# Patient Record
Sex: Male | Born: 1945 | Race: White | Hispanic: No | Smoking: Never smoker
Health system: Southern US, Community
[De-identification: ages and names within clinical notes are randomized; demographics above are authoritative.]

## PROBLEM LIST (undated history)

## (undated) DIAGNOSIS — T8859XA Other complications of anesthesia, initial encounter: Secondary | ICD-10-CM

## (undated) DIAGNOSIS — M25512 Pain in left shoulder: Secondary | ICD-10-CM

## (undated) DIAGNOSIS — C4490 Unspecified malignant neoplasm of skin, unspecified: Secondary | ICD-10-CM

## (undated) DIAGNOSIS — I499 Cardiac arrhythmia, unspecified: Secondary | ICD-10-CM

## (undated) DIAGNOSIS — A0472 Enterocolitis due to Clostridium difficile, not specified as recurrent: Secondary | ICD-10-CM

## (undated) DIAGNOSIS — I4892 Unspecified atrial flutter: Secondary | ICD-10-CM

## (undated) DIAGNOSIS — M1990 Unspecified osteoarthritis, unspecified site: Secondary | ICD-10-CM

## (undated) DIAGNOSIS — T4145XA Adverse effect of unspecified anesthetic, initial encounter: Secondary | ICD-10-CM

## (undated) DIAGNOSIS — K862 Cyst of pancreas: Secondary | ICD-10-CM

## (undated) DIAGNOSIS — M009 Pyogenic arthritis, unspecified: Secondary | ICD-10-CM

## (undated) DIAGNOSIS — Z9889 Other specified postprocedural states: Secondary | ICD-10-CM

## (undated) DIAGNOSIS — Z789 Other specified health status: Secondary | ICD-10-CM

## (undated) DIAGNOSIS — M25511 Pain in right shoulder: Secondary | ICD-10-CM

## (undated) DIAGNOSIS — R112 Nausea with vomiting, unspecified: Secondary | ICD-10-CM

## (undated) DIAGNOSIS — M47812 Spondylosis without myelopathy or radiculopathy, cervical region: Secondary | ICD-10-CM

## (undated) HISTORY — DX: Pain in left shoulder: M25.512

## (undated) HISTORY — PX: OTHER SURGICAL HISTORY: SHX169

## (undated) HISTORY — DX: Unspecified osteoarthritis, unspecified site: M19.90

## (undated) HISTORY — DX: Cyst of pancreas: K86.2

## (undated) HISTORY — PX: JOINT REPLACEMENT: SHX530

## (undated) HISTORY — DX: Spondylosis without myelopathy or radiculopathy, cervical region: M47.812

## (undated) HISTORY — DX: Pyogenic arthritis, unspecified: M00.9

## (undated) HISTORY — PX: APPENDECTOMY: SHX54

## (undated) HISTORY — PX: MOHS SURGERY: SUR867

## (undated) HISTORY — DX: Enterocolitis due to Clostridium difficile, not specified as recurrent: A04.72

## (undated) HISTORY — DX: Pain in right shoulder: M25.511

## (undated) HISTORY — PX: INGUINAL HERNIA REPAIR: SUR1180

## (undated) HISTORY — DX: Other specified health status: Z78.9

## (undated) HISTORY — DX: Unspecified malignant neoplasm of skin, unspecified: C44.90

---

## 2000-02-28 HISTORY — PX: COLONOSCOPY: SHX174

## 2006-01-29 ENCOUNTER — Ambulatory Visit: Payer: Self-pay | Admitting: Internal Medicine

## 2007-05-06 ENCOUNTER — Telehealth (INDEPENDENT_AMBULATORY_CARE_PROVIDER_SITE_OTHER): Payer: Self-pay | Admitting: *Deleted

## 2007-05-06 ENCOUNTER — Ambulatory Visit: Payer: Self-pay | Admitting: Internal Medicine

## 2007-05-10 ENCOUNTER — Telehealth (INDEPENDENT_AMBULATORY_CARE_PROVIDER_SITE_OTHER): Payer: Self-pay | Admitting: *Deleted

## 2008-02-28 HISTORY — PX: TOTAL KNEE ARTHROPLASTY: SHX125

## 2008-07-24 ENCOUNTER — Telehealth (INDEPENDENT_AMBULATORY_CARE_PROVIDER_SITE_OTHER): Payer: Self-pay | Admitting: *Deleted

## 2008-09-14 ENCOUNTER — Encounter (INDEPENDENT_AMBULATORY_CARE_PROVIDER_SITE_OTHER): Payer: Self-pay | Admitting: *Deleted

## 2008-11-09 ENCOUNTER — Ambulatory Visit: Payer: Self-pay | Admitting: Internal Medicine

## 2008-11-09 DIAGNOSIS — R599 Enlarged lymph nodes, unspecified: Secondary | ICD-10-CM | POA: Insufficient documentation

## 2008-11-09 DIAGNOSIS — M2550 Pain in unspecified joint: Secondary | ICD-10-CM | POA: Insufficient documentation

## 2008-11-09 DIAGNOSIS — R21 Rash and other nonspecific skin eruption: Secondary | ICD-10-CM

## 2008-11-09 DIAGNOSIS — IMO0001 Reserved for inherently not codable concepts without codable children: Secondary | ICD-10-CM

## 2008-11-09 DIAGNOSIS — Z85828 Personal history of other malignant neoplasm of skin: Secondary | ICD-10-CM

## 2008-11-11 LAB — CONVERTED CEMR LAB
Basophils Absolute: 0.1 10*3/uL (ref 0.0–0.1)
HCT: 39.6 % (ref 39.0–52.0)
Lymphs Abs: 0.8 10*3/uL (ref 0.7–4.0)
MCV: 94.4 fL (ref 78.0–100.0)
Monocytes Absolute: 0.3 10*3/uL (ref 0.1–1.0)
Neutrophils Relative %: 66.2 % (ref 43.0–77.0)
Platelets: 201 10*3/uL (ref 150.0–400.0)
RDW: 12.5 % (ref 11.5–14.6)

## 2008-11-12 ENCOUNTER — Telehealth (INDEPENDENT_AMBULATORY_CARE_PROVIDER_SITE_OTHER): Payer: Self-pay | Admitting: *Deleted

## 2008-11-12 ENCOUNTER — Encounter (INDEPENDENT_AMBULATORY_CARE_PROVIDER_SITE_OTHER): Payer: Self-pay | Admitting: *Deleted

## 2009-01-18 ENCOUNTER — Encounter: Payer: Self-pay | Admitting: Internal Medicine

## 2009-02-09 ENCOUNTER — Telehealth (INDEPENDENT_AMBULATORY_CARE_PROVIDER_SITE_OTHER): Payer: Self-pay | Admitting: *Deleted

## 2009-02-10 ENCOUNTER — Ambulatory Visit: Payer: Self-pay | Admitting: Cardiovascular Disease

## 2009-02-10 DIAGNOSIS — I4949 Other premature depolarization: Secondary | ICD-10-CM | POA: Insufficient documentation

## 2009-02-16 ENCOUNTER — Ambulatory Visit: Payer: Self-pay | Admitting: Internal Medicine

## 2009-02-16 ENCOUNTER — Telehealth (INDEPENDENT_AMBULATORY_CARE_PROVIDER_SITE_OTHER): Payer: Self-pay | Admitting: *Deleted

## 2009-02-16 DIAGNOSIS — M1990 Unspecified osteoarthritis, unspecified site: Secondary | ICD-10-CM | POA: Insufficient documentation

## 2009-02-16 DIAGNOSIS — B360 Pityriasis versicolor: Secondary | ICD-10-CM | POA: Insufficient documentation

## 2009-02-22 ENCOUNTER — Inpatient Hospital Stay (HOSPITAL_COMMUNITY): Admission: RE | Admit: 2009-02-22 | Discharge: 2009-02-25 | Payer: Self-pay | Admitting: Orthopedic Surgery

## 2009-03-12 ENCOUNTER — Telehealth: Payer: Self-pay | Admitting: Gastroenterology

## 2010-03-29 NOTE — Progress Notes (Signed)
Summary: Schedule recall colonoscopy  Phone Note Outgoing Call Call back at [facility] Phone [phone]   Call placed by: Christie Nottingham CMA Duncan Dull),  March 12, 2009 3:49 PM Call placed to: Patient Summary of Call: Called pt to schedule recall colonoscopy and he states he just had a total knee replacement and would like to schedule in a month and we can call him then. I told pt I will call him in a month to schedule a colonoscopy.  Initial call taken by: Christie Nottingham CMA Duncan Dull),  March 12, 2009 3:50 PM     Appended Document: Schedule recall colonoscopy left message for pt  to call back   Appended Document: Schedule recall colonoscopy left message for pt  to call back.

## 2010-05-30 LAB — BASIC METABOLIC PANEL
BUN: 10 mg/dL (ref 6–23)
BUN: 12 mg/dL (ref 6–23)
CO2: 28 mEq/L (ref 19–32)
CO2: 29 mEq/L (ref 19–32)
Calcium: 8.2 mg/dL — ABNORMAL LOW (ref 8.4–10.5)
Chloride: 101 mEq/L (ref 96–112)
Chloride: 103 mEq/L (ref 96–112)
Creatinine, Ser: 0.7 mg/dL (ref 0.4–1.5)
Creatinine, Ser: 0.77 mg/dL (ref 0.4–1.5)
GFR calc non Af Amer: >60 mL/min (ref >60)
Glucose, Bld: 114 mg/dL — ABNORMAL HIGH (ref 70–99)
Glucose, Bld: 116 mg/dL — ABNORMAL HIGH (ref 70–99)
Glucose, Bld: 128 mg/dL — ABNORMAL HIGH (ref 70–99)
Potassium: 3.9 mEq/L (ref 3.5–5.1)

## 2010-05-30 LAB — COMPREHENSIVE METABOLIC PANEL
ALT: 23 U/L (ref 0–53)
AST: 25 U/L (ref 0–37)
Alkaline Phosphatase: 60 U/L (ref 39–117)
CO2: 29 mEq/L (ref 19–32)
Chloride: 105 mEq/L (ref 96–112)
GFR calc non Af Amer: >60 mL/min (ref >60)
Potassium: 4.4 mEq/L (ref 3.5–5.1)
Sodium: 139 mEq/L (ref 135–145)
Total Bilirubin: 0.9 mg/dL (ref 0.3–1.2)

## 2010-05-30 LAB — CBC
HCT: 28.4 % — ABNORMAL LOW (ref 39.0–52.0)
HCT: 29.2 % — ABNORMAL LOW (ref 39.0–52.0)
HCT: 34 % — ABNORMAL LOW (ref 39.0–52.0)
Hemoglobin: 11.6 g/dL — ABNORMAL LOW (ref 13.0–17.0)
MCHC: 34.3 g/dL (ref 30.0–36.0)
MCHC: 35.1 g/dL (ref 30.0–36.0)
MCHC: 35.4 g/dL (ref 30.0–36.0)
MCV: 93.1 fL (ref 78.0–100.0)
MCV: 93.3 fL (ref 78.0–100.0)
Platelets: 169 10*3/uL (ref 150–400)
Platelets: 177 10*3/uL (ref 150–400)
Platelets: 235 10*3/uL (ref 150–400)
RDW: 12.7 % (ref 11.5–15.5)
RDW: 12.9 % (ref 11.5–15.5)
WBC: 5 10*3/uL (ref 4.0–10.5)

## 2010-05-30 LAB — URINALYSIS, ROUTINE W REFLEX MICROSCOPIC
Glucose, UA: NEGATIVE mg/dL
Ketones, ur: NEGATIVE mg/dL
pH: 7 (ref 5.0–8.0)

## 2010-05-30 LAB — URINE CULTURE
Colony Count: NO GROWTH
Culture: NO GROWTH

## 2010-05-30 LAB — DIFFERENTIAL
Basophils Absolute: 0 10*3/uL (ref 0.0–0.1)
Basophils Relative: 1 % (ref 0–1)
Eosinophils Relative: 2 % (ref 0–5)
Lymphocytes Relative: 22 % (ref 12–46)
Monocytes Absolute: 0.3 10*3/uL (ref 0.1–1.0)

## 2010-05-30 LAB — TYPE AND SCREEN: Antibody Screen: NEGATIVE

## 2010-05-30 LAB — PROTIME-INR: Prothrombin Time: 20.1 seconds — ABNORMAL HIGH (ref 11.6–15.2)

## 2010-11-21 ENCOUNTER — Other Ambulatory Visit: Payer: Self-pay | Admitting: Internal Medicine

## 2010-11-21 NOTE — Telephone Encounter (Signed)
Patient wants refill for cialis - [facility]

## 2010-11-21 NOTE — Telephone Encounter (Signed)
Dr.Hopper please advise, last OV 2010. Pending appointment 12/2010

## 2010-11-22 NOTE — Telephone Encounter (Signed)
OK #6; 1 every 3 days prn

## 2010-11-23 MED ORDER — TADALAFIL 20 MG PO TABS: ORAL_TABLET | ORAL | Status: DC

## 2011-01-23 ENCOUNTER — Encounter: Payer: Self-pay | Admitting: Internal Medicine

## 2011-01-27 ENCOUNTER — Ambulatory Visit (INDEPENDENT_AMBULATORY_CARE_PROVIDER_SITE_OTHER): Payer: Self-pay | Admitting: Internal Medicine

## 2011-01-27 ENCOUNTER — Encounter: Payer: Self-pay | Admitting: Internal Medicine

## 2011-01-27 VITALS — BP 112/74 | HR 69 | Temp 98.8°F | Resp 12 | Ht 79.5 in | Wt 229.4 lb

## 2011-01-27 DIAGNOSIS — M79672 Pain in left foot: Secondary | ICD-10-CM

## 2011-01-27 DIAGNOSIS — N138 Other obstructive and reflux uropathy: Secondary | ICD-10-CM

## 2011-01-27 DIAGNOSIS — N401 Enlarged prostate with lower urinary tract symptoms: Secondary | ICD-10-CM

## 2011-01-27 DIAGNOSIS — M79609 Pain in unspecified limb: Secondary | ICD-10-CM

## 2011-01-27 DIAGNOSIS — Z23 Encounter for immunization: Secondary | ICD-10-CM

## 2011-01-27 DIAGNOSIS — Z0000 Encounter for general adult medical examination without abnormal findings: Secondary | ICD-10-CM

## 2011-01-27 LAB — CBC WITH DIFFERENTIAL/PLATELET
Basophils Absolute: 0.1 10*3/uL (ref 0.0–0.1)
Eosinophils Relative: 2 % (ref 0–5)
Lymphocytes Relative: 31 % (ref 12–46)
Lymphs Abs: 1.6 10*3/uL (ref 0.7–4.0)
Neutro Abs: 2.8 10*3/uL (ref 1.7–7.7)
Neutrophils Relative %: 56 % (ref 43–77)
Platelets: 288 10*3/uL (ref 150–400)
RBC: 4.71 MIL/uL (ref 4.22–5.81)
RDW: 12.7 % (ref 11.5–15.5)
WBC: 5 10*3/uL (ref 4.0–10.5)

## 2011-01-27 LAB — HEPATIC FUNCTION PANEL
AST: 19 U/L (ref 0–37)
Alkaline Phosphatase: 72 U/L (ref 39–117)
Indirect Bilirubin: 0.3 mg/dL (ref 0.0–0.9)
Total Protein: 7.1 g/dL (ref 6.0–8.3)

## 2011-01-27 LAB — BASIC METABOLIC PANEL
BUN: 17 mg/dL (ref 6–23)
Calcium: 9.4 mg/dL (ref 8.4–10.5)
Creat: 0.9 mg/dL (ref 0.50–1.35)
Glucose, Bld: 84 mg/dL (ref 70–99)

## 2011-01-27 LAB — TSH: TSH: 1.806 u[IU]/mL (ref 0.350–4.500)

## 2011-01-27 LAB — LIPID PANEL
Cholesterol: 100 mg/dL (ref 0–200)
HDL: 55 mg/dL (ref >39)

## 2011-01-27 MED ORDER — GABAPENTIN 100 MG PO CAPS: ORAL_CAPSULE | ORAL | Status: AC

## 2011-01-27 MED ORDER — TADALAFIL 5 MG PO TABS: 5.0000 mg | ORAL_TABLET | Freq: Every day | ORAL | Status: DC

## 2011-01-27 NOTE — Progress Notes (Signed)
Addended by: Arnette Norris on: 01/27/2011 03:10 PM   Modules accepted: Orders

## 2011-01-27 NOTE — Progress Notes (Signed)
Addended by: Legrand Como on: 01/27/2011 02:59 PM   Modules accepted: Orders

## 2011-01-27 NOTE — Patient Instructions (Signed)
Preventive Health Care: Exercise at least 30-45 minutes a day,  3-4 days a week.  Health Care Power of Attorney & Living Will. Complete if not in place ; these place you in charge of your health care decisions. Take MVI WITHOUT iron daily. Large medical studies SUGGEST multi vitamins may decrease risk of some cancers in men. The most important intervention is still a  heart healthy nutritional program as Mediaterranean type diet with with lots of fruits and vegetables, up to 7-9 servings per day.Consume less than 40 grams of sugar per day from foods & drinks with High Fructose Corn Sugar as #1,2,3 or # 4 on label.

## 2011-01-27 NOTE — Progress Notes (Signed)
Subjective:    Patient ID: Kurt Young, male    DOB: 1946-02-25, 65 y.o.   MRN: [id number]  HPI  Dr Newman Pies is here for a physical;acute issues include chronic pain in LLE for > 1 year.      Review of Systems Extremity pain Location:L foot Onset:12 months ago Trigger/injury:?indirectly related to gait post TKR  01/2009 Pain quality:burning Pain severity:up to 8 Duration:constant Radiation:no Exacerbating factors:walking or resting for > 30 min Treatment/response:fascial steroid injections X2; effective < 1 week with 2nd Review of systems: Constitutional: no fever, chills, sweats.Weight up 15 # Musculoskeletal:no  muscle cramps or pain. Ankle stiffness present with swelling Skin:no rash, color change Neuro: no weakness; incontinence (stool/urine). Intermittent  numbness and tingling in foot Heme:no lymphadenopathy; abnormal bruising or bleeding .  He has raised pigeons. He denies cough, sputum production, or wheezing except with a recent bout of bronchitis.  He has some difficulty starting the stream at night only. He has a past history of mild urethral stricture for which he seen Dr. Patsi Sears.       Objective:   Physical Exam Gen.: Thin but healthy and well-nourished in appearance. Alert, appropriate and cooperative throughout exam. Head: Normocephalic without obvious abnormalities Eyes: No corneal or conjunctival inflammation noted. Pupils equal round reactive to light and accommodation. Fundal exam is benign without hemorrhages, exudate, papilledema. Extraocular motion intact. Vision grossly normal with lenses. Ears: External  ear exam reveals no significant lesions or deformities. Canals: small diameter with wax collections.Hearing is grossly normal bilaterally. Nose: External nasal exam reveals no deformity or inflammation. Nasal mucosa are pink and moist. No lesions or exudates noted.  Mouth: Oral mucosa and oropharynx reveal no lesions or exudates. Teeth in good  repair. Neck: No deformities, masses, or tenderness noted. Range of motion &. Thyroid normal. Lungs: Normal respiratory effort; chest expands symmetrically. Lungs are clear to auscultation without rales, wheezes, or increased work of breathing. Heart: Normal rate and rhythm. Normal S1 and S2. No gallop, click, or rub. S 4 w/o murmur. Abdomen: Bowel sounds normal; abdomen soft and nontender. No masses, organomegaly or hernias noted. Genitalia/DRE: Genital exams unremarkable. The prostate exhibits mild asymmetry with the left lobe greater than the right. There is no nodularity or induration.   .                                                                                   Musculoskeletal/extremities: No deformity or scoliosis noted of  the thoracic or lumbar spine. No clubbing, cyanosis, edema noted. Range of motion  normal .Tone & strength  Normal.Joints:isolated DIP changes. Crepitus L knee.  Nail health  good. The plantar fascia is not tender to percussion or palpation; but he has pain across the dorsum of the foot with compression. Vascular: Carotid, radial artery, dorsalis pedis and  posterior tibial pulses are full and equal. No bruits present. Neurologic: Alert and oriented x3. Deep tendon reflexes symmetrical and normal.          Skin: Intact without suspicious lesions or rashes. Scattered lipomas of the thorax. Lymph: No cervical, axillary, or inguinal lymphadenopathy present. Psych: Mood and affect are normal. Normally interactive  Assessment & Plan:  #1 comprehensive physical exam; no acute findings #2 see Problem List with Assessments & Recommendations  #3 left foot pain. The burning suggests a possible neuropathic type pain. Other differential diagnoses include an RSD variant or even fracture the small bones. I would recommend a trial of gabapentin with followup by Dr. Thurston Hole if no  better.  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 He may be a candidate for the daily Cialis 5 mg. Plan: see Orders

## 2011-02-13 ENCOUNTER — Emergency Department (INDEPENDENT_AMBULATORY_CARE_PROVIDER_SITE_OTHER): Payer: No Typology Code available for payment source

## 2011-02-13 ENCOUNTER — Emergency Department (HOSPITAL_BASED_OUTPATIENT_CLINIC_OR_DEPARTMENT_OTHER)
Admission: EM | Admit: 2011-02-13 | Discharge: 2011-02-13 | Disposition: A | Discharge status: Home / Self Care | Payer: No Typology Code available for payment source | Attending: Emergency Medicine | Admitting: Emergency Medicine

## 2011-02-13 ENCOUNTER — Encounter (HOSPITAL_BASED_OUTPATIENT_CLINIC_OR_DEPARTMENT_OTHER): Payer: Self-pay | Admitting: *Deleted

## 2011-02-13 DIAGNOSIS — R0602 Shortness of breath: Secondary | ICD-10-CM

## 2011-02-13 DIAGNOSIS — S39012A Strain of muscle, fascia and tendon of lower back, initial encounter: Secondary | ICD-10-CM

## 2011-02-13 DIAGNOSIS — S139XXA Sprain of joints and ligaments of unspecified parts of neck, initial encounter: Secondary | ICD-10-CM | POA: Insufficient documentation

## 2011-02-13 DIAGNOSIS — Y92410 Unspecified street and highway as the place of occurrence of the external cause: Secondary | ICD-10-CM | POA: Insufficient documentation

## 2011-02-13 DIAGNOSIS — S161XXA Strain of muscle, fascia and tendon at neck level, initial encounter: Secondary | ICD-10-CM

## 2011-02-13 DIAGNOSIS — IMO0002 Reserved for concepts with insufficient information to code with codable children: Secondary | ICD-10-CM | POA: Insufficient documentation

## 2011-02-13 DIAGNOSIS — Z79899 Other long term (current) drug therapy: Secondary | ICD-10-CM | POA: Insufficient documentation

## 2011-02-13 DIAGNOSIS — M549 Dorsalgia, unspecified: Secondary | ICD-10-CM

## 2011-02-13 MED ORDER — DIAZEPAM 5 MG PO TABS: 5.0000 mg | ORAL_TABLET | Freq: Two times a day (BID) | ORAL | Status: AC

## 2011-02-13 MED ORDER — IBUPROFEN 800 MG PO TABS: 800.0000 mg | ORAL_TABLET | Freq: Three times a day (TID) | ORAL | Status: AC

## 2011-02-13 MED ORDER — IBUPROFEN 800 MG PO TABS
800.0000 mg | ORAL_TABLET | Freq: Once | ORAL | Status: AC
  Administered 2011-02-13: 800 mg via ORAL
  Filled 2011-02-13: qty 1

## 2011-02-13 MED ORDER — IBUPROFEN 800 MG PO TABS: 800.0000 mg | ORAL_TABLET | Freq: Three times a day (TID) | ORAL | Status: DC
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
## 2011-02-13 NOTE — ED Notes (Signed)
Removed from spine board by PA Schinlever

## 2011-02-14 NOTE — ED Provider Notes (Signed)
Medical screening examination/treatment/procedure(s) were performed by non-physician practitioner and as supervising physician I was immediately available for consultation/collaboration.   Gerhard Munch, MD 02/14/11 Marlyne Beards

## 2011-03-03 ENCOUNTER — Ambulatory Visit (INDEPENDENT_AMBULATORY_CARE_PROVIDER_SITE_OTHER): Payer: No Typology Code available for payment source | Admitting: Internal Medicine

## 2011-03-03 ENCOUNTER — Encounter: Payer: Self-pay | Admitting: Internal Medicine

## 2011-03-03 ENCOUNTER — Ambulatory Visit (INDEPENDENT_AMBULATORY_CARE_PROVIDER_SITE_OTHER)
Admission: RE | Admit: 2011-03-03 | Discharge: 2011-03-03 | Disposition: A | Discharge status: Home / Self Care | Payer: No Typology Code available for payment source | Source: Ambulatory Visit | Attending: Internal Medicine | Admitting: Internal Medicine

## 2011-03-03 DIAGNOSIS — M5412 Radiculopathy, cervical region: Secondary | ICD-10-CM

## 2011-03-03 NOTE — Patient Instructions (Signed)
Order for x-rays entered into  the computer; these will be performed at [address] across from [facility]. No appointment is necessary. 
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
## 2011-03-06 ENCOUNTER — Other Ambulatory Visit: Payer: Self-pay | Admitting: Internal Medicine

## 2011-03-06 DIAGNOSIS — M5030 Other cervical disc degeneration, unspecified cervical region: Secondary | ICD-10-CM

## 2011-03-06 DIAGNOSIS — M5412 Radiculopathy, cervical region: Secondary | ICD-10-CM

## 2011-03-07 ENCOUNTER — Ambulatory Visit: Payer: No Typology Code available for payment source | Admitting: Internal Medicine

## 2011-03-10 ENCOUNTER — Ambulatory Visit: Payer: No Typology Code available for payment source | Admitting: Internal Medicine

## 2011-03-13 ENCOUNTER — Other Ambulatory Visit (HOSPITAL_COMMUNITY): Payer: No Typology Code available for payment source

## 2011-03-17 ENCOUNTER — Inpatient Hospital Stay (HOSPITAL_COMMUNITY): Admission: RE | Admit: 2011-03-17 | Payer: No Typology Code available for payment source | Source: Ambulatory Visit

## 2011-09-07 ENCOUNTER — Other Ambulatory Visit: Payer: Self-pay | Admitting: *Deleted

## 2011-09-07 MED ORDER — TADALAFIL 20 MG PO TABS: ORAL_TABLET | ORAL | Status: DC

## 2011-09-07 NOTE — Telephone Encounter (Signed)
Cialis 20 mg one every 3 days as needed dispense 6, refill x1

## 2011-09-07 NOTE — Telephone Encounter (Signed)
Last OV 03-03-11, last Filled 11-23-10 #6.Please advise

## 2011-09-07 NOTE — Telephone Encounter (Signed)
Rx sent 

## 2012-02-08 ENCOUNTER — Other Ambulatory Visit: Payer: Self-pay | Admitting: Internal Medicine

## 2012-02-08 NOTE — Telephone Encounter (Signed)
Refill X 5

## 2012-02-08 NOTE — Telephone Encounter (Signed)
Please advise if medication to be filled for daily use or every 3rd day as needed (filled with both instructions based on past refills)

## 2013-01-30 ENCOUNTER — Telehealth: Payer: Self-pay | Admitting: *Deleted

## 2013-01-30 ENCOUNTER — Encounter: Payer: Self-pay | Admitting: Internal Medicine
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
## 2013-01-30 NOTE — Telephone Encounter (Signed)
See letter.

## 2013-01-31 NOTE — Telephone Encounter (Signed)
Called and left message for patient letting him know that his letter is ready. He can either pick it up or we can fax it for him, whichever he prefers.

## 2013-05-02 ENCOUNTER — Encounter: Payer: Self-pay | Admitting: Family Medicine

## 2013-05-02 ENCOUNTER — Ambulatory Visit (INDEPENDENT_AMBULATORY_CARE_PROVIDER_SITE_OTHER): Payer: Medicare Other | Admitting: Family Medicine

## 2013-05-02 ENCOUNTER — Ambulatory Visit (HOSPITAL_BASED_OUTPATIENT_CLINIC_OR_DEPARTMENT_OTHER)
Admission: RE | Admit: 2013-05-02 | Discharge: 2013-05-02 | Disposition: A | Discharge status: Home / Self Care | Payer: Medicare Other | Source: Ambulatory Visit | Attending: Family Medicine | Admitting: Family Medicine

## 2013-05-02 VITALS — BP 111/73 | HR 67 | Temp 97.6°F | Resp 16 | Ht 78.0 in | Wt 217.0 lb

## 2013-05-02 DIAGNOSIS — J180 Bronchopneumonia, unspecified organism: Secondary | ICD-10-CM

## 2013-05-02 DIAGNOSIS — R05 Cough: Secondary | ICD-10-CM | POA: Insufficient documentation

## 2013-05-02 DIAGNOSIS — R059 Cough, unspecified: Secondary | ICD-10-CM | POA: Insufficient documentation

## 2013-05-02 DIAGNOSIS — R509 Fever, unspecified: Secondary | ICD-10-CM | POA: Insufficient documentation

## 2013-05-02 DIAGNOSIS — R0989 Other specified symptoms and signs involving the circulatory and respiratory systems: Secondary | ICD-10-CM | POA: Insufficient documentation

## 2013-05-02 MED ORDER — CLARITHROMYCIN 500 MG PO TABS: ORAL_TABLET | ORAL | Status: DC

## 2013-05-02 MED ORDER — HYDROCODONE-HOMATROPINE 5-1.5 MG/5ML PO SYRP: ORAL_SOLUTION | ORAL | Status: DC

## 2013-05-02 MED ORDER — PREDNISONE 20 MG PO TABS: ORAL_TABLET | ORAL | Status: DC

## 2013-05-02 NOTE — Progress Notes (Signed)
OFFICE NOTE  05/02/2013  CC:  Chief Complaint  Patient presents with  . Establish Care  . URI    x 2 weeks  . Cough  . Fatigue     HPI: Patient is a 68 y.o. Caucasian male who is here for establish care--transfer from Dr. Linna Darner. Here for resp sx's.  Onset a couple of weeks ago of cough, ST, rattly, nasal/sinus congestion, very fatigued but no fever.  ST only lasted a couple of days.  Denies chest tightness or wheezing. +HA but no pain in muscles or joints. Laying in bed the last week.  Eating and drinking well.  A couple of days ago he tried to go to work but got nauseated and vomited the remainder of the day.  Since then he has had no n/v.  No diarrhea.   No rash.  Pertinent PMH:  Past Medical History  Diagnosis Date  . DJD (degenerative joint disease)   . Skin cancer     melanoma X 2; squamous cell X 1  . Urethral stricture 2010    Dr Hartley Barefoot   Past Surgical History  Procedure Laterality Date  . Mohs surgery      X 2 of back  . Tonsillectomy    . Appendectomy    . Hernia repair    . Total knee arthroplasty  2010    Dr Noemi Chapel  . Colonoscopy  2002    negative   Family History  Problem Relation Age of Onset  . Heart disease Father 26    MI;smoker  . Diabetes Brother     juvenile    MEDS:  Outpatient Prescriptions Prior to Visit  Medication Sig Dispense Refill  . fish oil-omega-3 fatty acids 1000 MG capsule Take 1 g by mouth daily.        . Multiple Vitamin (MULTIVITAMIN) tablet Take 1 tablet by mouth daily.        [name] Propylene Glycol-Glycerin (MOISTURE EYES) 1-0.3 % SOLN Apply 1 drop to eye at bedtime as needed.        . vitamin C (ASCORBIC ACID) 500 MG tablet Take 500 mg by mouth daily.        . vitamin E (VITAMIN E) 400 UNIT capsule Take 400 Units by mouth daily.        [name] CIALIS 5 MG tablet TAKE 1 TABLET BY MOUTH EVERY DAY  30 tablet  5  . tadalafil (CIALIS) 20 MG tablet One every 3 days as needed  6 tablet  1   No facility-administered medications prior  to visit.    PE: Blood pressure 111/73, pulse 67, temperature 97.6 F (36.4 C), temperature source Temporal, resp. rate 16, height 6\' 6"  (1.981 m), weight 217 lb (98.431 kg), SpO2 97.00%. Gen: Alert, tired-appearing.  Patient is oriented to person, place, time, and situation. ENT: Ears: EACs clear, normal epithelium.  TMs with good light reflex and landmarks bilaterally.  Eyes: no injection, icteris, swelling, or exudate.  EOMI, PERRLA. Nose: no drainage or turbinate edema/swelling.  No injection or focal lesion.  Mouth: lips without lesion/swelling.  Oral mucosa pink and moist.  Dentition intact and without obvious caries or gingival swelling.  Oropharynx without erythema, exudate, or swelling.  Neck - No masses or thyromegaly or limitation in range of motion.  A few small, palpable but nontender lymph nodes are present in anterior cervical region CV: RRR, no m/r/g LUNGS: CTA bilat on inspiration, nonlabored resps.  Diffuse exp coarse wheeze and mild prolongation of exp  phase.   EXT: no clubbing, cyanosis, or edema.   LAB: none today  IMPRESSION AND PLAN:  New pt/transfer: discussed general hx. Bronchopneumonia: clarithromycin 500mg  bid x 10d. Prednisone 40mg  qd x 5d. Hycodan susp 1-2 tsp q6h prn, #216ml. Mucinex DM encouraged in daytime, hycodan for hs. CXR ordered to be done today. Signs/symptoms to call or return for were reviewed and pt expressed understanding.  An After Visit Summary was printed and given to the patient.  FOLLOW UP: if not better in 4-5d

## 2013-05-02 NOTE — Progress Notes (Signed)
Pre visit review using our clinic review tool, if applicable. No additional management support is needed unless otherwise documented below in the visit note. 

## 2014-07-10 ENCOUNTER — Ambulatory Visit (INDEPENDENT_AMBULATORY_CARE_PROVIDER_SITE_OTHER): Payer: Medicare Other | Admitting: Nurse Practitioner

## 2014-07-10 ENCOUNTER — Encounter: Payer: Self-pay | Admitting: Nurse Practitioner

## 2014-07-10 VITALS — BP 96/59 | HR 79 | Temp 98.2°F | Ht 78.0 in | Wt 229.0 lb

## 2014-07-10 DIAGNOSIS — J069 Acute upper respiratory infection, unspecified: Secondary | ICD-10-CM

## 2014-07-10 MED ORDER — PREDNISONE 10 MG PO TABS: ORAL_TABLET | ORAL | Status: DC

## 2014-07-10 MED ORDER — HYDROCODONE-HOMATROPINE 5-1.5 MG/5ML PO SYRP: 5.0000 mL | ORAL_SOLUTION | Freq: Every evening | ORAL | Status: DC | PRN

## 2014-07-10 MED ORDER — BENZONATATE 200 MG PO CAPS: 200.0000 mg | ORAL_CAPSULE | Freq: Three times a day (TID) | ORAL | Status: DC | PRN

## 2014-07-10 NOTE — Progress Notes (Signed)
Pre visit review using our clinic review tool, if applicable. No additional management support is needed unless otherwise documented below in the visit note. 

## 2014-07-10 NOTE — Patient Instructions (Signed)
This is a viral illness. It may last 3-4 weeks. Take benzonatate capsules to decrease dry cough. Use cough syrup at night. Take prednisone in mornings.  Rest. Let us know if you develop fever or chest pain with inspiration.  Bronchitis Bronchitis is inflammation of the airways that extend from the windpipe into the lungs (bronchi). The inflammation often causes mucus to develop, which leads to a cough. If the inflammation becomes severe, it may cause shortness of breath. CAUSES  Bronchitis may be caused by:   Viral infections.   Bacteria.   Cigarette smoke.   Allergens, pollutants, and other irritants.  SIGNS AND SYMPTOMS  The most common symptom of bronchitis is a frequent cough that produces mucus. Other symptoms include:  Fever.   Body aches.   Chest congestion.   Chills.   Shortness of breath.   Sore throat.  DIAGNOSIS  Bronchitis is usually diagnosed through a medical history and physical exam. Tests, such as chest X-rays, are sometimes done to rule out other conditions.  TREATMENT  You may need to avoid contact with whatever caused the problem (smoking, for example). Medicines are sometimes needed. These may include:  Antibiotics. These may be prescribed if the condition is caused by bacteria.  Cough suppressants. These may be prescribed for relief of cough symptoms.   Inhaled medicines. These may be prescribed to help open your airways and make it easier for you to breathe.   Steroid medicines. These may be prescribed for those with recurrent (chronic) bronchitis. HOME CARE INSTRUCTIONS  Get plenty of rest.   Drink enough fluids to keep your urine clear or pale yellow (unless you have a medical condition that requires fluid restriction). Increasing fluids may help thin your secretions and will prevent dehydration.   Only take over-the-counter or prescription medicines as directed by your health care provider.  Only take antibiotics as directed.  Make sure you finish them even if you start to feel better.  Avoid secondhand smoke, irritating chemicals, and strong fumes. These will make bronchitis worse. If you are a smoker, quit smoking. Consider using nicotine gum or skin patches to help control withdrawal symptoms. Quitting smoking will help your lungs heal faster.   Put a cool-mist humidifier in your bedroom at night to moisten the air. This may help loosen mucus. Change the water in the humidifier daily. You can also run the hot water in your shower and sit in the bathroom with the door closed for 5 10 minutes.   Follow up with your health care provider as directed.   Wash your hands frequently to avoid catching bronchitis again or spreading an infection to others.  SEEK MEDICAL CARE IF: Your symptoms do not improve after 1 week of treatment.  SEEK IMMEDIATE MEDICAL CARE IF:  Your fever increases.  You have chills.   You have chest pain.   You have worsening shortness of breath.   You have bloody sputum.  You faint.  You have lightheadedness.  You have a severe headache.   You vomit repeatedly. MAKE SURE YOU:   Understand these instructions.  Will watch your condition.  Will get help right away if you are not doing well or get worse. Document Released: 02/13/2005 Document Revised: 12/04/2012 Document Reviewed: 10/08/2012 [facility] Patient Information 2014 [place].

## 2014-07-10 NOTE — Progress Notes (Signed)
   Subjective:    Patient ID: Kurt Young, male    DOB: [birth year], 69 y.o.   MRN: [id number]  Cough This is a new problem. The current episode started in the past 7 days (2d). The problem has been gradually worsening. The problem occurs hourly. The cough is non-productive. Associated symptoms include chills, ear congestion and nasal congestion. Pertinent negatives include no chest pain, ear pain, fever, headaches, shortness of breath or wheezing. Associated symptoms comments: Posterior cervical LAD. Nothing aggravates the symptoms. Treatments tried: mucinex D.      Review of Systems  Constitutional: Positive for chills. Negative for fever.  HENT: Negative for ear pain.   Respiratory: Positive for cough. Negative for shortness of breath and wheezing.   Cardiovascular: Negative for chest pain.  Neurological: Negative for headaches.       Objective:   Physical Exam  Constitutional: He is oriented to person, place, and time. He appears well-developed and well-nourished.  HENT:  Head: Normocephalic and atraumatic.  Right Ear: External ear normal.  Left Ear: External ear normal.  Mouth/Throat: No oropharyngeal exudate.  Tonsils absent.  Posterior pharynx erythematous w/cobblestoning   Eyes: Conjunctivae are normal. Right eye exhibits no discharge. Left eye exhibits no discharge.  Neck: Normal range of motion. Neck supple. No thyromegaly present.  R pstr cervical node enlarged-1 cm, tender ovoid  Cardiovascular: Normal rate, regular rhythm and normal heart sounds.   Pulmonary/Chest: Effort normal. No respiratory distress. He has no wheezes. He has no rales.  Coarse breath sounds  Lymphadenopathy:    He has cervical adenopathy.  Neurological: He is alert and oriented to person, place, and time.  Skin: Skin is warm and dry.  Psychiatric: He has a normal mood and affect. His behavior is normal. Thought content normal.  Vitals reviewed.         Assessment & Plan:  1. Upper  respiratory infection with cough and congestion Likely viral  - HYDROcodone-homatropine (HYCODAN) 5-1.5 MG/5ML syrup; Take 5 mLs by mouth at bedtime as needed for cough.  Dispense: 120 mL; Refill: 0 - benzonatate (TESSALON) 200 MG capsule; Take 1 capsule (200 mg total) by mouth 3 (three) times daily as needed for cough.  Dispense: 60 capsule; Refill: 0 - predniSONE (DELTASONE) 10 MG tablet; Take 4Tpo qam X 2d, then 3T po qam X 2d, then 2T po qd X 2d, then 1T po qam X 2d.  Dispense: 20 tablet; Refill: 0 - Upper Respiratory Culture  F/u PRN

## 2014-07-13 LAB — CULTURE, UPPER RESPIRATORY: ORGANISM ID, BACTERIA: NORMAL

## 2014-07-14 ENCOUNTER — Telehealth: Payer: Self-pay | Admitting: Nurse Practitioner

## 2014-07-14 NOTE — Telephone Encounter (Signed)
pls call pt: Strep culture neg. Viral respiratory/sore throat illness. Continue with comfort measures as discussed. 

## 2014-07-14 NOTE — Telephone Encounter (Signed)
LMOVM-identified about lab results. Patient to call back with any questions or concerns. DPR Signed.  

## 2014-07-15 ENCOUNTER — Other Ambulatory Visit: Payer: Self-pay | Admitting: Nurse Practitioner

## 2014-07-15 ENCOUNTER — Telehealth: Payer: Self-pay | Admitting: Family Medicine

## 2014-07-15 NOTE — Telephone Encounter (Signed)
Please Advise? Called and spoke with patient. States that he has no strength, a cough (productive), congestion, right ear sounds congested and cant hear out of it. States he has no fever or chest pain. Prednisone didn't seem to do much, having a hard time sleeping at night due to cough. He has been pushing fluids and he just wants to know his next steps?

## 2014-07-15 NOTE — Telephone Encounter (Signed)
He can try pseudoephedrine 30 mg 3 times daily for few days for nasal congestion & "stopped up" ear.  I will send in benzonatate capsule for cough.  He has developed new symptoms-sounds like viral illness, but he should come in for OV if ear becomes painful, pseudoephedrine not helpful, of he has concerns.

## 2014-07-15 NOTE — Telephone Encounter (Signed)
Called and informed patient. 

## 2014-07-15 NOTE — Telephone Encounter (Signed)
Patient is very lethargic, no chest pain no fever. Please call.

## 2014-07-17 ENCOUNTER — Other Ambulatory Visit: Payer: Self-pay | Admitting: Nurse Practitioner

## 2014-07-17 ENCOUNTER — Telehealth: Payer: Self-pay | Admitting: Family Medicine

## 2014-07-17 DIAGNOSIS — J069 Acute upper respiratory infection, unspecified: Secondary | ICD-10-CM

## 2014-07-17 MED ORDER — HYDROCODONE-HOMATROPINE 5-1.5 MG/5ML PO SYRP
5.0000 mL | ORAL_SOLUTION | Freq: Every evening | ORAL | Status: DC | PRN
Start: 2014-07-17 — End: 2015-02-26

## 2014-07-17 NOTE — Telephone Encounter (Signed)
No problem. He can pick up at desk.

## 2014-07-17 NOTE — Telephone Encounter (Signed)
Pt. Was senn a week ago and is still not feeling well. He is asking for a refill on his cough medicine as it is the only way he can sleep

## 2014-07-17 NOTE — Telephone Encounter (Signed)
Called and informed patient. 

## 2014-07-17 NOTE — Telephone Encounter (Signed)
Please advise. Patient called 07/15/14 and this was the documentation on that.   Irene Pap, NP at 07/15/2014 3:26 PM     Status: Signed       Expand All Collapse All   He can try pseudoephedrine 30 mg 3 times daily for few days for nasal congestion & "stopped up" ear. I will send in benzonatate capsule for cough.  He has developed new symptoms-sounds like viral illness, but he should come in for OV if ear becomes painful, pseudoephedrine not helpful, of he has concerns.            Audley Hose, CMA at 07/15/2014 1:17 PM     Status: Signed       Expand All Collapse All   Please Advise? Called and spoke with patient. States that he has no strength, a cough (productive), congestion, right ear sounds congested and cant hear out of it. States he has no fever or chest pain. Prednisone didn't seem to do much, having a hard time sleeping at night due to cough. He has been pushing fluids and he just wants to know his next steps?

## 2014-09-23 DIAGNOSIS — N401 Enlarged prostate with lower urinary tract symptoms: Secondary | ICD-10-CM | POA: Diagnosis not present

## 2014-09-23 DIAGNOSIS — R351 Nocturia: Secondary | ICD-10-CM | POA: Diagnosis not present

## 2014-09-23 DIAGNOSIS — N138 Other obstructive and reflux uropathy: Secondary | ICD-10-CM | POA: Diagnosis not present

## 2014-09-23 DIAGNOSIS — N486 Induration penis plastica: Secondary | ICD-10-CM | POA: Diagnosis not present

## 2014-12-13 DIAGNOSIS — Z23 Encounter for immunization: Secondary | ICD-10-CM | POA: Diagnosis not present

## 2015-02-26 ENCOUNTER — Ambulatory Visit (INDEPENDENT_AMBULATORY_CARE_PROVIDER_SITE_OTHER): Payer: Medicare Other | Admitting: Family Medicine

## 2015-02-26 ENCOUNTER — Encounter: Payer: Self-pay | Admitting: Family Medicine

## 2015-02-26 ENCOUNTER — Ambulatory Visit: Payer: Medicare Other | Admitting: Family Medicine

## 2015-02-26 VITALS — BP 114/77 | HR 64 | Temp 98.1°F | Resp 16 | Ht 78.0 in | Wt 221.5 lb

## 2015-02-26 DIAGNOSIS — J069 Acute upper respiratory infection, unspecified: Secondary | ICD-10-CM

## 2015-02-26 DIAGNOSIS — J209 Acute bronchitis, unspecified: Secondary | ICD-10-CM

## 2015-02-26 MED ORDER — HYDROCODONE-HOMATROPINE 5-1.5 MG/5ML PO SYRP: ORAL_SOLUTION | ORAL | Status: DC

## 2015-02-26 NOTE — Progress Notes (Signed)
OFFICE NOTE  02/26/2015  CC:  Chief Complaint  Patient presents with  . Cough    x 1 week   HPI: Patient is a 69 y.o. Caucasian male who is here for cough.  Onset 1 week ago of nasal congestion with PND, cough that is gradually worsening.  Worse supine/hs. Some "fits", dry and hacky cough.  No wheezing, no chest tightness, no SOB.  No fever.  No ST. PND still very significant.  No face or upper teeth pain.  Neti pot and mucinex DM not much help.   Pertinent PMH:  Past medical, surgical, social, and family history reviewed and no changes are noted since last office visit. Non-smoker.  MEDS:  Outpatient Prescriptions Prior to Visit  Medication Sig Dispense Refill  . fish oil-omega-3 fatty acids 1000 MG capsule Take 1 g by mouth daily.      . Multiple Vitamin (MULTIVITAMIN) tablet Take 1 tablet by mouth daily.      [name] Propylene Glycol-Glycerin (MOISTURE EYES) 1-0.3 % SOLN Apply 1 drop to eye at bedtime as needed.      . tamsulosin (FLOMAX) 0.4 MG CAPS capsule   11  . vitamin C (ASCORBIC ACID) 500 MG tablet Take 500 mg by mouth daily.      . vitamin E (VITAMIN E) 400 UNIT capsule Take 400 Units by mouth daily.      . benzonatate (TESSALON) 200 MG capsule Take 1 capsule (200 mg total) by mouth 3 (three) times daily as needed for cough. (Patient not taking: Reported on 02/26/2015) 60 capsule 0  . CIALIS 5 MG tablet TAKE 1 TABLET BY MOUTH EVERY DAY (Patient not taking: Reported on 07/10/2014) 30 tablet 5  . finasteride (PROSCAR) 5 MG tablet Take 5 mg by mouth daily. Reported on 02/26/2015  11  . HYDROcodone-homatropine (HYCODAN) 5-1.5 MG/5ML syrup Take 5 mLs by mouth at bedtime as needed for cough. (Patient not taking: Reported on 02/26/2015) 120 mL 0  . predniSONE (DELTASONE) 10 MG tablet Take 4Tpo qam X 2d, then 3T po qam X 2d, then 2T po qd X 2d, then 1T po qam X 2d. (Patient not taking: Reported on 02/26/2015) 20 tablet 0   No facility-administered medications prior to visit.     PE: Blood pressure 114/77, pulse 64, temperature 98.1 F (36.7 C), temperature source Oral, resp. rate 16, height 6\' 6"  (1.981 m), weight 221 lb 8 oz (100.472 kg), SpO2 95 %. VS: noted--normal. Gen: alert, NAD, NONTOXIC APPEARING. HEENT: eyes without injection, drainage, or swelling.  Ears: EACs clear, TMs with normal light reflex and landmarks.  Nose: Clear rhinorrhea, with some dried, crusty exudate adherent to mildly injected mucosa.  No purulent d/c.  No paranasal sinus TTP.  No facial swelling.  Throat and mouth without focal lesion.  No pharyngial swelling, erythema, or exudate.   Neck: supple, no LAD.   LUNGS: CTA bilat, nonlabored resps.   CV: RRR, no m/r/g. EXT: no c/c/e SKIN: no rash    IMPRESSION AND PLAN:  Viral URI and acute bronchitis. No sign of RAD. Continue symptomatic care, add hycodan syrup 1-2 tsp tid prn, #116ml. Signs/symptoms to call or return for were reviewed and pt expressed understanding.  An After Visit Summary was printed and given to the patient.  FOLLOW UP: prn

## 2015-02-26 NOTE — Progress Notes (Signed)
Pre visit review using our clinic review tool, if applicable. No additional management support is needed unless otherwise documented below in the visit note. 

## 2015-03-19 ENCOUNTER — Ambulatory Visit (INDEPENDENT_AMBULATORY_CARE_PROVIDER_SITE_OTHER): Payer: Medicare Other | Admitting: Family Medicine

## 2015-03-19 ENCOUNTER — Encounter: Payer: Self-pay | Admitting: Family Medicine

## 2015-03-19 VITALS — BP 131/72 | HR 74 | Temp 98.3°F | Resp 16 | Ht 78.0 in | Wt 222.5 lb

## 2015-03-19 DIAGNOSIS — S76219A Strain of adductor muscle, fascia and tendon of unspecified thigh, initial encounter: Secondary | ICD-10-CM

## 2015-03-19 DIAGNOSIS — S39011A Strain of muscle, fascia and tendon of abdomen, initial encounter: Secondary | ICD-10-CM | POA: Diagnosis not present

## 2015-03-19 NOTE — Progress Notes (Signed)
OFFICE VISIT  03/19/2015   CC:  Chief Complaint  Patient presents with  . Abdominal Pain    LRQ x 4 days  . Cough   HPI:    Patient is a 70 y.o. Caucasian male who presents for onset of RLQ/Right groin area, constant, worse with anything that increases intra-abd pressure and with prolonged standing.  He can feel no bulge in the area.  Wearing a jock strap helped--the tightness of the band made it feel better.  Has hx of L inguinal hernia and is s/p repair. No dysuria, urinary urgency or frequency.  No change in bowel habits.  EAting and drinking fine. No nausea.  Past Medical History  Diagnosis Date  . DJD (degenerative joint disease)   . Skin cancer     melanoma X 2; squamous cell X 1  . Urethral stricture 2010    Dr Hartley Barefoot    Past Surgical History  Procedure Laterality Date  . Mohs surgery      X 2 of back  . Tonsillectomy    . Appendectomy    . Inguinal hernia repair Left   . Total knee arthroplasty  2010    Dr Noemi Chapel  . Colonoscopy  2002    negative    Outpatient Prescriptions Prior to Visit  Medication Sig Dispense Refill  . fish oil-omega-3 fatty acids 1000 MG capsule Take 1 g by mouth daily.      . Multiple Vitamin (MULTIVITAMIN) tablet Take 1 tablet by mouth daily.      [name] Propylene Glycol-Glycerin (MOISTURE EYES) 1-0.3 % SOLN Apply 1 drop to eye at bedtime as needed.      . tamsulosin (FLOMAX) 0.4 MG CAPS capsule Take 0.4 mg by mouth daily.   11  . vitamin C (ASCORBIC ACID) 500 MG tablet Take 500 mg by mouth daily.      . vitamin E (VITAMIN E) 400 UNIT capsule Take 400 Units by mouth daily.      [name] HYDROcodone-homatropine (HYCODAN) 5-1.5 MG/5ML syrup 1-2 tsp po tid prn cough (Patient not taking: Reported on 03/19/2015) 180 mL 0   No facility-administered medications prior to visit.    Allergies  Allergen Reactions  . Codeine     REACTION: angioedema  . Morphine And Related Hives    ROS As per HPI  PE: Blood pressure 131/72, pulse 74, temperature  98.3 F (36.8 C), temperature source Oral, resp. rate 16, height 6\' 6"  (1.981 m), weight 222 lb 8 oz (100.925 kg), SpO2 94 %. Gen: Alert, well appearing.  Patient is oriented to person, place, time, and situation. No inguinal bulge or asymmetry on inspection. No palpable soft tissue bulge/swelling in RLQ or R groin region, but he has R groin region TTP when he coughs--this area of tenderness is appox the size of a golf [place] or a little bigger.  Scrotum and testicles and penis exam are normal today.  LABS:  none  IMPRESSION AND PLAN:  Right groin/abd wall muscle strain: related to his recent coughing/bronchitis illness. No sign of hernia on exam today. Reassured pt.  Signs/symptoms to call or return for were reviewed and pt expressed understanding. Aleve 440 mg with food bid x 10d recommended.  An After Visit Summary was printed and given to the patient.  FOLLOW UP: Return if symptoms worsen or fail to improve.

## 2015-03-19 NOTE — Progress Notes (Signed)
Pre visit review using our clinic review tool, if applicable. No additional management support is needed unless otherwise documented below in the visit note. 

## 2015-03-19 NOTE — Patient Instructions (Signed)
Take 2 otc aleve tabs twice a day with food for 10 days.

## 2015-05-05 DIAGNOSIS — N401 Enlarged prostate with lower urinary tract symptoms: Secondary | ICD-10-CM | POA: Diagnosis not present

## 2015-05-05 DIAGNOSIS — Z0000 Encounter for general adult medical examination without abnormal findings: Secondary | ICD-10-CM | POA: Diagnosis not present

## 2015-05-05 DIAGNOSIS — N138 Other obstructive and reflux uropathy: Secondary | ICD-10-CM | POA: Diagnosis not present

## 2015-05-05 DIAGNOSIS — N486 Induration penis plastica: Secondary | ICD-10-CM | POA: Diagnosis not present

## 2015-05-14 ENCOUNTER — Ambulatory Visit (INDEPENDENT_AMBULATORY_CARE_PROVIDER_SITE_OTHER): Payer: Medicare Other | Admitting: Family Medicine

## 2015-05-14 ENCOUNTER — Encounter: Payer: Self-pay | Admitting: Family Medicine

## 2015-05-14 VITALS — BP 121/78 | HR 65 | Temp 97.8°F | Resp 20 | Wt 226.5 lb

## 2015-05-14 DIAGNOSIS — J209 Acute bronchitis, unspecified: Secondary | ICD-10-CM | POA: Insufficient documentation

## 2015-05-14 MED ORDER — PREDNISONE 20 MG PO TABS: ORAL_TABLET | ORAL | Status: DC

## 2015-05-14 MED ORDER — HYDROCODONE-HOMATROPINE 5-1.5 MG/5ML PO SYRP: 5.0000 mL | ORAL_SOLUTION | Freq: Three times a day (TID) | ORAL | Status: DC | PRN

## 2015-05-14 MED ORDER — AZITHROMYCIN 250 MG PO TABS: ORAL_TABLET | ORAL | Status: DC

## 2015-05-14 NOTE — Progress Notes (Signed)
Patient ID: ANDRICK GURKIN, male   DOB: [birth year], 70 y.o.   MRN: [id number]    MIKHAL WICKWIRE , 20-Jan-1946, 70 y.o., male MRN: [id number]  CC: sinus congestion Subjective: Pt presents for an acute OV with complaints of sinus congestion of 4 days duration. Associated symptoms include body aches, dry cough, rattling in chest and chest soreness from coughing. Patient denies nausea, vomit, diarrhea, sore throat or fatigue. Pt has tried Mucinex to ease their symptoms. Patient is a Pharmacist, community.  Flu shot UTD, PNA x1, Tdap unknown. No lung disease history.  Allergies  Allergen Reactions  . Codeine     REACTION: angioedema  . Morphine And Related Hives   Social History  Substance Use Topics  . Smoking status: Never Smoker   . Smokeless tobacco: Not on file  . Alcohol Use: 1.2 oz/week    2 Glasses of wine per week     Comment:     Past Medical History  Diagnosis Date  . DJD (degenerative joint disease)   . Skin cancer     melanoma X 2; squamous cell X 1  . Urethral stricture 2010    Dr Hartley Barefoot   Past Surgical History  Procedure Laterality Date  . Mohs surgery      X 2 of back  . Tonsillectomy    . Appendectomy    . Inguinal hernia repair Left   . Total knee arthroplasty  2010    Dr Noemi Chapel  . Colonoscopy  2002    negative   Family History  Problem Relation Age of Onset  . Heart disease Father 38    MI;smoker  . Diabetes Brother     juvenile     Medication List       This list is accurate as of: 05/14/15 10:47 AM.  Always use your most recent med list.               fish oil-omega-3 fatty acids 1000 MG capsule  Take 1 g by mouth daily.     MOISTURE EYES 1-0.3 % Soln  Generic drug:  Propylene Glycol-Glycerin  Apply 1 drop to eye at bedtime as needed.     multivitamin tablet  Take 1 tablet by mouth daily.     tamsulosin 0.4 MG Caps capsule  Commonly known as:  FLOMAX  Take 0.4 mg by mouth daily.     vitamin C 500 MG tablet  Commonly known as:  ASCORBIC  ACID  Take 500 mg by mouth daily.     vitamin E 400 UNIT capsule  Generic drug:  vitamin E  Take 400 Units by mouth daily.         ROS: Negative, with the exception of above mentioned in HPI   Objective:  BP 121/78 mmHg  Pulse 65  Temp(Src) 97.8 F (36.6 C)  Resp 20  Wt 226 lb 8 oz (102.74 kg)  SpO2 97% Body mass index is 26.18 kg/(m^2). Gen: Afebrile. No acute distress. Nontoxic in appearance, well-developed, well-nourished, Caucasian male. HENT: AT. [place]. Bilateral TM visualized, air fluid levels present, no erythema or bulging. MMM, no oral lesions. Bilateral nares moderate erythema, mild bogginess. Throat without erythema or exudates.  cough on exam, no hoarseness on exam, no tenderness to palpation maxillary sinus. Eyes:Pupils Equal Round Reactive to light, Extraocular movements intact,  Conjunctiva without redness, discharge or icterus. Neck/lymp/endocrine: Supple, no lymphadenopathy CV: RRR Chest: CTAB, no wheeze or crackles. Good air movement, normal resp effort.  Skin: No rashes,  purpura or petechiae.  Neuro: Normal gait. PERLA. EOMi. Alert. Oriented x3   Assessment/Plan: CHAQUILLE PURDUM is a 70 y.o. male present for acute OV for  1. Acute bronchitis, unspecified organism - Discussed with patient this is likely a viral sinusitis/bronchitis illness. Encouraged him to use Flonase, Mucinex, rest and hydrate.  - Hycodan cough syrup prescribed for cough, patient states he has taken this in the past without allergy. - Z-Pak and prednisone prescribed and printed, encouraged patient if he is not improving by Sunday to have prescriptions filled and start medications, otherwise treat symptomatically. - azithromycin (ZITHROMAX) 250 MG tablet; 500 mg day 1, then 250 mg daily  Dispense: 6 tablet; Refill: 0 - HYDROcodone-homatropine (HYCODAN) 5-1.5 MG/5ML syrup; Take 5 mLs by mouth every 8 (eight) hours as needed for cough.  Dispense: 120 mL; Refill: 0  electronically signed  by:  Howard Pouch, DO  [place]

## 2015-05-14 NOTE — Patient Instructions (Signed)
-   flonase  - mucinex - hycodan - zpack - rest/hydrate.   I believe it is viral syndrome. I have prescribed zpack/prednisone  if it progresses to cover bronchitis. If not improved start medications.   Viral Infections A viral infection can be caused by different types of viruses.Most viral infections are not serious and resolve on their own. However, some infections may cause severe symptoms and may lead to further complications. SYMPTOMS Viruses can frequently cause:  Minor sore throat.  Aches and pains.  Headaches.  Runny nose.  Different types of rashes.  Watery eyes.  Tiredness.  Cough.  Loss of appetite.  Gastrointestinal infections, resulting in nausea, vomiting, and diarrhea. These symptoms do not respond to antibiotics because the infection is not caused by bacteria. However, you might catch a bacterial infection following the viral infection. This is sometimes called a "superinfection." Symptoms of such a bacterial infection may include:  Worsening sore throat with pus and difficulty swallowing.  Swollen neck glands.  Chills and a high or persistent fever.  Severe headache.  Tenderness over the sinuses.  Persistent overall ill feeling (malaise), muscle aches, and tiredness (fatigue).  Persistent cough.  Yellow, green, or brown mucus production with coughing. HOME CARE INSTRUCTIONS   Only take over-the-counter or prescription medicines for pain, discomfort, diarrhea, or fever as directed by your caregiver.  Drink enough water and fluids to keep your urine clear or pale yellow. Sports drinks can provide valuable electrolytes, sugars, and hydration.  Get plenty of rest and maintain proper nutrition. Soups and broths with crackers or rice are fine. SEEK IMMEDIATE MEDICAL CARE IF:   You have severe headaches, shortness of breath, chest pain, neck pain, or an unusual rash.  You have uncontrolled vomiting, diarrhea, or you are unable to keep down  fluids.  You or your child has an oral temperature above 102 F (38.9 C), not controlled by medicine.  Your baby is older than 3 months with a rectal temperature of 102 F (38.9 C) or higher.  Your baby is 13 months old or younger with a rectal temperature of 100.4 F (38 C) or higher. MAKE SURE YOU:   Understand these instructions.  Will watch your condition.  Will get help right away if you are not doing well or get worse.   This information is not intended to replace advice given to you by your health care provider. Make sure you discuss any questions you have with your health care provider.   Document Released: 11/23/2004 Document Revised: 05/08/2011 Document Reviewed: 07/22/2014 Elsevier Interactive Patient Education [employer].

## 2015-05-20 ENCOUNTER — Telehealth: Payer: Self-pay | Admitting: Family Medicine

## 2015-05-20 NOTE — Telephone Encounter (Signed)
Pt states he does not feel any better since visit last Friday. Was wondering if he needs a chest xray or another round of abx. Please advise.

## 2015-05-21 NOTE — Telephone Encounter (Signed)
  Dr Raoul Pitch made aware patient has appt Saturday.

## 2015-05-21 NOTE — Telephone Encounter (Signed)
Pt called to check status of possible chest xray. I put him on Saturday clinic with Dr Anitra Lauth and advised that he discuss the possibility of steroids or additional abx at his visit tomorrow.

## 2015-05-21 NOTE — Telephone Encounter (Signed)
If patient is worsening in symptoms, or fever/chills after abx use, then he should be seen. Z-pack has great coverage for bronchitis/Community acquired PNA etc. If he is worsening I would likely want a CXR. He may be able to be seen on Saturday clinic? Or UC if he can not wait until Monday.

## 2015-05-22 ENCOUNTER — Telehealth: Payer: Self-pay

## 2015-05-22 ENCOUNTER — Ambulatory Visit (INDEPENDENT_AMBULATORY_CARE_PROVIDER_SITE_OTHER): Payer: Medicare Other | Admitting: Family Medicine

## 2015-05-22 ENCOUNTER — Encounter: Payer: Self-pay | Admitting: Family Medicine

## 2015-05-22 VITALS — BP 122/70 | HR 82 | Temp 99.0°F | Wt 218.0 lb

## 2015-05-22 DIAGNOSIS — J180 Bronchopneumonia, unspecified organism: Secondary | ICD-10-CM | POA: Diagnosis not present

## 2015-05-22 DIAGNOSIS — J209 Acute bronchitis, unspecified: Secondary | ICD-10-CM

## 2015-05-22 MED ORDER — CLARITHROMYCIN 500 MG PO TABS: 500.0000 mg | ORAL_TABLET | Freq: Two times a day (BID) | ORAL | Status: DC

## 2015-05-22 MED ORDER — PREDNISONE 20 MG PO TABS: ORAL_TABLET | ORAL | Status: DC

## 2015-05-22 MED ORDER — HYDROCODONE-HOMATROPINE 5-1.5 MG/5ML PO SYRP: 5.0000 mL | ORAL_SOLUTION | Freq: Three times a day (TID) | ORAL | Status: DC | PRN

## 2015-05-22 MED ORDER — ALBUTEROL SULFATE HFA 108 (90 BASE) MCG/ACT IN AERS: 1.0000 | INHALATION_SPRAY | RESPIRATORY_TRACT | Status: DC | PRN

## 2015-05-22 MED ORDER — METHYLPREDNISOLONE ACETATE 80 MG/ML IJ SUSP
80.0000 mg | Freq: Once | INTRAMUSCULAR | Status: AC
  Administered 2015-05-22: 80 mg via INTRAMUSCULAR

## 2015-05-22 NOTE — Progress Notes (Signed)
OFFICE VISIT  05/22/2015   CC:  Chief Complaint  Patient presents with  . Follow-up    no improvement in Sx--coughing more---productive clear sputum--pt reports he is more weak and now SOB---pt has also been taking Mucinex    HPI:    Patient is a 70 y.o. Caucasian male who presents to Saturday clinic for ongoing respiratory illness.  Pt saw Dr. Raoul Pitch 05/14/15 for upper and lower resp sx's, was rx'd z pack and prednisone to hold and fill if not improving with symptomatic tx in a few days.  He did not take the z pack.  He did take a 5d course of prednisone and felt a bit better regarding malaise and cough.  Then cough and malaise returned with a vengeance.  Feels mild SOB but denies labored breathing.  No fevers but often feels shivers and wakes up with sweats  No n/v/d.  He is drinking fluids well.  Past Medical History  Diagnosis Date  . DJD (degenerative joint disease)   . Skin cancer     melanoma X 2; squamous cell X 1  . Urethral stricture 2010    Dr Hartley Barefoot    Past Surgical History  Procedure Laterality Date  . Mohs surgery      X 2 of back  . Tonsillectomy    . Appendectomy    . Inguinal hernia repair Left   . Total knee arthroplasty  2010    Dr Noemi Chapel  . Colonoscopy  2002    negative    Outpatient Prescriptions Prior to Visit  Medication Sig Dispense Refill  . fish oil-omega-3 fatty acids 1000 MG capsule Take 1 g by mouth daily.      . Multiple Vitamin (MULTIVITAMIN) tablet Take 1 tablet by mouth daily.      [name] Propylene Glycol-Glycerin (MOISTURE EYES) 1-0.3 % SOLN Apply 1 drop to eye at bedtime as needed.      . tamsulosin (FLOMAX) 0.4 MG CAPS capsule Take 0.4 mg by mouth daily.   11  . vitamin C (ASCORBIC ACID) 500 MG tablet Take 500 mg by mouth daily.      . vitamin E (VITAMIN E) 400 UNIT capsule Take 400 Units by mouth daily.      [name] HYDROcodone-homatropine (HYCODAN) 5-1.5 MG/5ML syrup Take 5 mLs by mouth every 8 (eight) hours as needed for cough. 120 mL 0  .  predniSONE (DELTASONE) 20 MG tablet 2 tabs po qd x 5d 10 tablet 0  . azithromycin (ZITHROMAX) 250 MG tablet 500 mg day 1, then 250 mg daily (Patient not taking: Reported on 05/22/2015) 6 tablet 0   No facility-administered medications prior to visit.    Allergies  Allergen Reactions  . Codeine     REACTION: angioedema  . Morphine And Related Hives    ROS As per HPI  PE: Blood pressure 122/70, pulse 82, temperature 99 F (37.2 C), temperature source Oral, weight 218 lb (98.884 kg), SpO2 96 %. VS: noted--normal. Gen: alert, NAD, NONTOXIC APPEARING. HEENT: eyes without injection, drainage, or swelling.  Ears: EACs clear, TMs with normal light reflex and landmarks.  Nose: Clear rhinorrhea, with some dried, crusty exudate adherent to mildly injected mucosa.  No purulent d/c.  No paranasal sinus TTP.  No facial swelling.  Throat and mouth without focal lesion.  No pharyngial swelling, erythema, or exudate.   Neck: supple, no LAD.   LUNGS: CTA bilat with question of mild insp crackles in L base, nonlabored resps.  Excessive post exhalation  coughing noted. CV: RRR, no m/r/g. EXT: no c/c/e SKIN: no rash  LABS:  None today  IMPRESSION AND PLAN:  Acute Bronchopneumonia, suspect bacterial etiology. Clarithromycin 500 mg bid x 10d. Depo medrol 80 mg IM in clinic today. Then, starting tomorrow, prednisone 40mg  qd x 4d, then 20mg  qd x 5d, then 10mg  qd x 4d. Hycodan syrup Rx: 120 ml given today. Ventolin inhaler rx'd today.  An After Visit Summary was printed and given to the patient.  FOLLOW UP: Return if symptoms worsen or fail to improve.  Signed:  Crissie Sickles, MD           05/22/2015

## 2015-05-22 NOTE — Progress Notes (Signed)
Pre visit review using our clinic review tool, if applicable. No additional management support is needed unless otherwise documented below in the visit note. 

## 2015-05-22 NOTE — Telephone Encounter (Signed)
[facility] states Hycodan is out of stock--spoke to the pharmacy and they stated that all they have in stock is Tessalon, Robitussin AC and Tussionex... Attempted to call pt to see if he wanted to just pick up Rx and see if another pharmacy has Hycodan in stock--no answer lmovm

## 2015-05-22 NOTE — Addendum Note (Signed)
Addended by: Lurlean Nanny on: 05/22/2015 01:55 PM   Modules accepted: Orders

## 2015-05-24 NOTE — Telephone Encounter (Signed)
Ended up calling in [facility]bitussin AC to [facility] [place] On 05/22/15.

## 2015-05-25 ENCOUNTER — Telehealth: Payer: Self-pay | Admitting: *Deleted

## 2015-05-25 MED ORDER — HYDROCOD POLST-CPM POLST ER 10-8 MG/5ML PO SUER: 5.0000 mL | Freq: Two times a day (BID) | ORAL | Status: DC

## 2015-05-25 NOTE — Telephone Encounter (Signed)
Tussionex rx printed as per pt request.

## 2015-05-25 NOTE — Telephone Encounter (Signed)
Patient called states he is allergic to codiene which is in Robitussin wants tussionex Rx he states all pharmacies out of Hycodan. He wants to pick up Rx today.Please advise

## 2015-05-25 NOTE — Telephone Encounter (Signed)
Patient called states no pharmacies have Hycodan and he is allergic to Robitussin he is requesting an Rx for Tussinex. He would like to pick up Rx today Please advise.

## 2015-05-26 NOTE — Telephone Encounter (Signed)
Left message on cell vm stating that Rx is ready for p/u at front desk.

## 2015-06-14 ENCOUNTER — Telehealth: Payer: Self-pay | Admitting: Family Medicine

## 2015-06-14 NOTE — Telephone Encounter (Signed)
Noted.  Health team triaged him to the ED, which I agree with.

## 2015-06-14 NOTE — Telephone Encounter (Signed)
Patient called in asking for an appointment for EKG due to his heart racing & feeling sweaty & clammy Saturday after mowing the grass on Saturday. He has been a patient at [facility]. Was able to get him an appointment 06/30/15. Transferred patient to [facility]. Please let me know if there is anything else I need to do.

## 2015-06-14 NOTE — Telephone Encounter (Signed)
FYI

## 2015-06-14 NOTE — Telephone Encounter (Signed)
Patient Name: Kurt Young DOB: 11-24-1945 Initial Comment Caller states his heart is racing- and has not stopped- and he is light headed and SOBNurse Assessment Nurse: Vallery Sa, RN, Cathy Date/Time (Eastern Time): 06/14/2015 9:43:45 AM Confirm and document reason for call. If symptomatic, describe symptoms. You must click the next button to save text entered. ---Caller states he developed shortness of breath and a racing heart two days ago while mowing that is still present today. No chest pain. Alert and responsive. Has the patient traveled out of the country within the last 30 days? ---No Does the patient have any new or worsening symptoms? ---Yes Will a triage be completed? ---Yes Related visit to physician within the last 2 weeks? ---Yes Does the PT have any chronic conditions? (i.e. diabetes, asthma, etc.) ---Yes List chronic conditions. ---Prostate problems, left knee replacement, Bronchitis recently Is this a behavioral health or substance abuse call? ---No Guidelines Guideline Title Affirmed Question Affirmed Notes Breathing Difficulty [1] MODERATE difficulty breathing (e.g., speaks in phrases, SOB even at rest, pulse 100-120) AND [2] NEW-onset or WORSE than normal Final Disposition User Go to ED Now Vallery Sa, RN, [facility] - ED Disagree/Comply: Comply

## 2015-06-14 NOTE — Telephone Encounter (Signed)
Agree with triage to ED

## 2015-06-17 DIAGNOSIS — D485 Neoplasm of uncertain behavior of skin: Secondary | ICD-10-CM | POA: Diagnosis not present

## 2015-06-17 DIAGNOSIS — Z86018 Personal history of other benign neoplasm: Secondary | ICD-10-CM | POA: Diagnosis not present

## 2015-06-17 DIAGNOSIS — D225 Melanocytic nevi of trunk: Secondary | ICD-10-CM | POA: Diagnosis not present

## 2015-06-17 DIAGNOSIS — Z85828 Personal history of other malignant neoplasm of skin: Secondary | ICD-10-CM | POA: Diagnosis not present

## 2015-06-17 DIAGNOSIS — L570 Actinic keratosis: Secondary | ICD-10-CM | POA: Diagnosis not present

## 2015-06-17 DIAGNOSIS — L821 Other seborrheic keratosis: Secondary | ICD-10-CM | POA: Diagnosis not present

## 2015-06-17 DIAGNOSIS — Z87898 Personal history of other specified conditions: Secondary | ICD-10-CM | POA: Diagnosis not present

## 2015-06-30 ENCOUNTER — Ambulatory Visit (INDEPENDENT_AMBULATORY_CARE_PROVIDER_SITE_OTHER): Payer: Medicare Other | Admitting: Cardiology

## 2015-06-30 ENCOUNTER — Encounter: Payer: Self-pay | Admitting: Cardiology

## 2015-06-30 ENCOUNTER — Encounter: Payer: Self-pay | Admitting: *Deleted

## 2015-06-30 VITALS — BP 110/68 | HR 132 | Ht 79.0 in | Wt 223.6 lb

## 2015-06-30 DIAGNOSIS — R9431 Abnormal electrocardiogram [ECG] [EKG]: Secondary | ICD-10-CM | POA: Diagnosis not present

## 2015-06-30 DIAGNOSIS — I4892 Unspecified atrial flutter: Secondary | ICD-10-CM | POA: Diagnosis not present

## 2015-06-30 DIAGNOSIS — R5382 Chronic fatigue, unspecified: Secondary | ICD-10-CM

## 2015-06-30 LAB — CBC
HCT: 38.4 % — ABNORMAL LOW (ref 38.5–50.0)
HEMOGLOBIN: 13 g/dL — AB (ref 13.2–17.1)
MCH: 30.8 pg (ref 27.0–33.0)
MCHC: 33.9 g/dL (ref 32.0–36.0)
MCV: 91 fL (ref 80.0–100.0)
MPV: 9.5 fL (ref 7.5–12.5)
Platelets: 268 10*3/uL (ref 140–400)
RBC: 4.22 MIL/uL (ref 4.20–5.80)
RDW: 14.5 % (ref 11.0–15.0)
WBC: 4.9 10*3/uL (ref 3.8–10.8)

## 2015-06-30 LAB — TSH: TSH: 1.31 m[IU]/L (ref 0.40–4.50)

## 2015-06-30 LAB — T4, FREE: FREE T4: 1.4 ng/dL (ref 0.8–1.8)

## 2015-06-30 MED ORDER — METOPROLOL TARTRATE 25 MG PO TABS: 25.0000 mg | ORAL_TABLET | Freq: Two times a day (BID) | ORAL | Status: DC

## 2015-06-30 MED ORDER — RIVAROXABAN 20 MG PO TABS: 20.0000 mg | ORAL_TABLET | Freq: Every day | ORAL | Status: DC

## 2015-06-30 NOTE — Patient Instructions (Addendum)
Medication Instructions:  1. START XARELTO 20 MG DAILY AT SUPPER; START TODAY  2. START METOPROLOL TARTRATE 25 MG 1 TABLET TWICE DAILY  Labwork: 1. TODAY CMET, CBC, TSH, FREE T4  Testing/Procedures: Your physician has requested that you have a TEE/Cardioversion. During a TEE, sound waves are used to create images of your heart. It provides your doctor with information about the size and shape of your heart and how well your heart's chambers and valves are working. In this test, a transducer is attached to the end of a flexible tube that is guided down you throat and into your esophagus (the tube leading from your mouth to your stomach) to get a more detailed image of your heart. Once the TEE has determined that a blood clot is not present, the cardioversion begins. Electrical Cardioversion uses a jolt of electricity to your heart either through paddles or wired patches attached to your chest. This is a controlled, usually prescheduled, procedure. This procedure is done at the hospital and you are not awake during the procedure. You usually go home the day of the procedure. Please see the instruction sheet given to you today for more information.    Follow-Up: 07/23/15 @ 3:45 WITH DR. Marlou Porch S/P TEE/DCCV F/U APPT  Any Other Special Instructions Will Be Listed Below (If Applicable).     If you need a refill on your cardiac medications before your next appointment, please call your pharmacy.

## 2015-06-30 NOTE — Progress Notes (Signed)
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 Has been feeling short of breath especially with exertional activity such as cutting grass.[name] Heart rate in clinic on 06/30/15 is 132 bpm with typical appearing atrial flutter pattern, negative deflection in lead 23 aVF and positive in V1. Variable conduction. He is not currently on anticoagulation.  On 06/11/15 - mowing with push mower, light headed, dizzy. Heart was racing, pounding. Over the past year he has noted frequent bouts of palpitations that originally started for a few seconds duration than lasting minutes then hours then days.  Normal rate 60's. 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   Father was recommended pacer, cardac arrest in snow.   Non smoker. Bronchitis 2 weeks before this recent episode.  Mild coffee. No significant alcohol use. No stimulants, no Sudafed.  BPH -prior bladder bleeding episode. Dr. Gaynelle Arabian. No HTN.  When lay on left side heart palps. No orthopnea. No diarrhea. No DM.   Currently he is comfortable sitting in front of me, no increased work of breathing. He does note however when trying to exert himself he feels short of breath especially with his current heart rate.  Past Medical History  Diagnosis Date  . DJD (degenerative joint disease)   . Skin cancer     melanoma X 2; squamous cell X 1  . Urethral stricture 2010    Dr Hartley Barefoot    Past Surgical History  Procedure Laterality Date  . Mohs surgery      X 2 of back  . Tonsillectomy    . Appendectomy    . Inguinal hernia repair Left   . Total knee arthroplasty  2010    Dr Noemi Chapel  . Colonoscopy  2002    negative    Current Medications: Outpatient  Prescriptions Prior to Visit  Medication Sig Dispense Refill  . Multiple Vitamin (MULTIVITAMIN) tablet Take 1 tablet by mouth daily.      [name] Propylene Glycol-Glycerin (MOISTURE EYES) 1-0.3 % SOLN Apply 1 drop to eye at bedtime as needed.      . tamsulosin (FLOMAX) 0.4 MG CAPS capsule Take 0.4 mg by mouth daily.   11  . vitamin C (ASCORBIC ACID) 500 MG tablet Take 500 mg by mouth daily.      . vitamin E (VITAMIN E) 400 UNIT capsule Take 400 Units by mouth daily.      . fish oil-omega-3 fatty acids 1000 MG capsule Take 1 g by mouth daily.      [name] albuterol (VENTOLIN HFA) 108 (90 Base) MCG/ACT inhaler Inhale 1-2 puffs into the lungs every 4 (four) hours as needed for wheezing or shortness of breath. 1 Inhaler 0  . azithromycin (ZITHROMAX) 250 MG tablet 500 mg day 1, then 250 mg daily (Patient not taking: Reported on 05/22/2015) 6 tablet 0  . chlorpheniramine-HYDROcodone (TUSSIONEX PENNKINETIC ER) 10-8 MG/5ML SUER Take 5 mLs by mouth 2 (two) times daily. 115 mL 0  . clarithromycin (BIAXIN) 500 MG tablet Take 1 tablet (500 mg total) by mouth 2 (two) times daily. 20 tablet 0  . predniSONE (DELTASONE) 20 MG tablet 2 tabs po qd x 4d, then 1 tab po  qd x 5d, then 1/2 tab po qd x 4d 15 tablet 0   No facility-administered medications prior to visit.     Allergies:   Codeine and Morphine and related   Social History   Social History  . Marital Status: Divorced    Spouse Name: N/A  . Number of Children: 2  . Years of Education: N/A   Occupational History  . Dentist    Social History Main Topics  . Smoking status: Never Smoker   . Smokeless tobacco: None  . Alcohol Use: 1.2 oz/week    2 Glasses of wine per week     Comment:    . Drug Use: No  . Sexual Activity: Not Asked   Other Topics Concern  . None   Social History Narrative   Divorced, 2 sons and 1 daughter.   Lives in [place].   Occupation: Pharmacist, community in [place], [place].   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   No tobacco, no alchol, no  drugs.   Raises pigeons     Family History:  The patient's family history includes Diabetes in his brother; Heart disease (age of onset: 35) in his father.   ROS:   Please see the history of present illness.   Excessive sweating, fatigue, skipped heartbeats, dizziness, cough, shortness of breath with activity ROS All other systems reviewed and are negative.   PHYSICAL EXAM:   VS:  BP 110/68 mmHg  Pulse 132  Ht 6\' 7"  (2.007 m)  Wt 223 lb 9.6 oz (101.424 kg)  BMI 25.18 kg/m2   GEN: Well nourished, well developed, tall in no acute distress HEENT: normal Neck: no JVD, carotid bruits, or masses Cardiac: Irreg; tachy, no murmurs, rubs, or gallops,no edema  Respiratory:  clear to auscultation bilaterally, normal work of breathing GI: soft, nontender, nondistended, + BS MS: no deformity or atrophy Skin: warm and dry, no rash Neuro:  Alert and Oriented x 3, Strength and sensation are intact Psych: euthymic mood, full affect  Wt Readings from Last 3 Encounters:  06/30/15 223 lb 9.6 oz (101.424 kg)  05/22/15 218 lb (98.884 kg)  05/14/15 226 lb 8 oz (102.74 kg)      Studies/Labs Reviewed:   EKG:  EKG is ordered today. 06/30/15-heart rate 132 bpm with atrial flutter, typical appearance.  Recent Labs: No results found for requested labs within last 365 days.   Lipid Panel    Component Value Date/Time   CHOL 100 01/27/2011 1450   TRIG 70 01/27/2011 1450   HDL 55 01/27/2011 1450   CHOLHDL 1.8 01/27/2011 1450   VLDL 14 01/27/2011 1450   LDLCALC 31 01/27/2011 1450    Additional studies/ records that were reviewed today include:   EKGs reviewed.    ASSESSMENT:    1. Atrial flutter, unspecified type ([place])   2. Abnormal EKG   3. Chronic fatigue      PLAN:  In order of problems listed above:  Atrial flutter -Newly discovered 06/30/15. He's had potentially 3 weeks of consistent flutter at 130 bpm however. Asymptomatic. Feeling short of breath with activity. I'm concerned  about the possibility of tachycardia-induced cardiomyopathy and would like to go ahead and schedule him for TEE cardioversion. Risks and benefits of procedure discussed. -We will start Xarelto 20 mg at night. He will stop his tumeric and fish oil supplements. Understands risks of bleeding, discussed stroke risk. -CHADSVASC - 1 (age) but don't know if CHF. -We will also start metoprolol tartrate 25  mg twice a day. Hopefully this will help with rate control although atrial flutter is very challenging to rate control. -If symptoms worsen or become more worrisome he is to contact us. -We are going to check TSH, free T4, complete metabolic profile and CBC. -He did have a bout of bronchitis which may have triggered this recent bout although he seems to have had a natural course of more and more duration of paroxysmal atrial flutter over the last year. -We will see him back soon after cardioversion.  Dyspnea on exertion -Secondary to atrial flutter. Hopefully he does not have a tachycardia-induced cardio myopathy.  BPH -He previously had some bleeding in his bladder. Dr. Gaynelle Arabian has performed thorough workup. He has not had any recent bleeding despite taking fish oil as well as tumoric.  Finasteride has helped him significant with his prostate.    Medication Adjustments/Labs and Tests Ordered: Current medicines are reviewed at length with the patient today.  Concerns regarding medicines are outlined above.  Medication changes, Labs and Tests ordered today are listed in the Patient Instructions below. Patient Instructions  Medication Instructions:  1. START XARELTO 20 MG DAILY AT SUPPER; START TODAY  2. START METOPROLOL TARTRATE 25 MG 1 TABLET TWICE DAILY  Labwork: 1. TODAY CMET, CBC, TSH, FREE T4  Testing/Procedures: Your physician has requested that you have a TEE/Cardioversion. During a TEE, sound waves are used to create images of your heart. It provides your doctor with information about  the size and shape of your heart and how well your heart's chambers and valves are working. In this test, a transducer is attached to the end of a flexible tube that is guided down you throat and into your esophagus (the tube leading from your mouth to your stomach) to get a more detailed image of your heart. Once the TEE has determined that a blood clot is not present, the cardioversion begins. Electrical Cardioversion uses a jolt of electricity to your heart either through paddles or wired patches attached to your chest. This is a controlled, usually prescheduled, procedure. This procedure is done at the hospital and you are not awake during the procedure. You usually go home the day of the procedure. Please see the instruction sheet given to you today for more information.    Follow-Up: 07/23/15 @ 3:45 WITH DR. Marlou Porch S/P TEE/DCCV F/U APPT  Any Other Special Instructions Will Be Listed Below (If Applicable).     If you need a refill on your cardiac medications before your next appointment, please call your pharmacy.       Bobby Rumpf, MD  06/30/2015 3:23 PM    [facility] [address], [place], [place]  [address] Phone: [phone]; Fax: [phone]

## 2015-07-01 ENCOUNTER — Telehealth: Payer: Self-pay | Admitting: Cardiology

## 2015-07-01 LAB — COMPREHENSIVE METABOLIC PANEL
ALBUMIN: 4.1 g/dL (ref 3.6–5.1)
ALK PHOS: 68 U/L (ref 40–115)
ALT: 21 U/L (ref 9–46)
AST: 17 U/L (ref 10–35)
BUN: 15 mg/dL (ref 7–25)
CALCIUM: 8.9 mg/dL (ref 8.6–10.3)
CO2: 24 mmol/L (ref 20–31)
Chloride: 103 mmol/L (ref 98–110)
Creat: 0.97 mg/dL (ref 0.70–1.25)
GLUCOSE: 89 mg/dL (ref 65–99)
POTASSIUM: 4 mmol/L (ref 3.5–5.3)
Sodium: 139 mmol/L (ref 135–146)
Total Bilirubin: 0.4 mg/dL (ref 0.2–1.2)
Total Protein: 6.5 g/dL (ref 6.1–8.1)

## 2015-07-01 NOTE — Telephone Encounter (Signed)
Reviewed results of lab with pt who states understanding. 

## 2015-07-01 NOTE — Telephone Encounter (Signed)
Dr.Heinlein is returning a call to [facility] about some results . Please call the office and have them patch her thru to Dr. [name]

## 2015-07-05 ENCOUNTER — Ambulatory Visit (HOSPITAL_COMMUNITY): Payer: Medicare Other | Admitting: Anesthesiology

## 2015-07-05 ENCOUNTER — Ambulatory Visit (HOSPITAL_COMMUNITY)
Admission: RE | Admit: 2015-07-05 | Discharge: 2015-07-05 | Disposition: A | Discharge status: Home / Self Care | Payer: Medicare Other | Source: Ambulatory Visit | Attending: Internal Medicine | Admitting: Internal Medicine

## 2015-07-05 ENCOUNTER — Encounter (HOSPITAL_COMMUNITY)
Admission: RE | Disposition: A | Discharge status: Home / Self Care | Payer: Self-pay | Source: Ambulatory Visit | Attending: Internal Medicine

## 2015-07-05 ENCOUNTER — Ambulatory Visit (HOSPITAL_BASED_OUTPATIENT_CLINIC_OR_DEPARTMENT_OTHER): Payer: Medicare Other

## 2015-07-05 DIAGNOSIS — Z96659 Presence of unspecified artificial knee joint: Secondary | ICD-10-CM | POA: Diagnosis not present

## 2015-07-05 DIAGNOSIS — Q211 Atrial septal defect: Secondary | ICD-10-CM | POA: Insufficient documentation

## 2015-07-05 DIAGNOSIS — I4892 Unspecified atrial flutter: Secondary | ICD-10-CM

## 2015-07-05 DIAGNOSIS — I10 Essential (primary) hypertension: Secondary | ICD-10-CM | POA: Diagnosis not present

## 2015-07-05 HISTORY — PX: TEE WITHOUT CARDIOVERSION: SHX5443

## 2015-07-05 HISTORY — DX: Unspecified atrial flutter: I48.92

## 2015-07-05 HISTORY — PX: CARDIOVERSION: SHX1299

## 2015-07-05 SURGERY — ECHOCARDIOGRAM, TRANSESOPHAGEAL
Anesthesia: Monitor Anesthesia Care

## 2015-07-05 MED ORDER — PROPOFOL 10 MG/ML IV BOLUS
INTRAVENOUS | Status: DC | PRN
  Administered 2015-07-05: 90 mg via INTRAVENOUS
  Administered 2015-07-05 (×3): 40 mg via INTRAVENOUS
  Administered 2015-07-05: 50 mg via INTRAVENOUS

## 2015-07-05 MED ORDER — SODIUM CHLORIDE 0.9 % IV SOLN
INTRAVENOUS | Status: DC
  Administered 2015-07-05 (×2): via INTRAVENOUS

## 2015-07-05 MED ORDER — ONDANSETRON HCL 4 MG/2ML IJ SOLN
INTRAMUSCULAR | Status: DC | PRN
  Administered 2015-07-05: 4 mg via INTRAVENOUS

## 2015-07-05 MED ORDER — LIDOCAINE HCL (CARDIAC) 20 MG/ML IV SOLN
INTRAVENOUS | Status: DC | PRN
  Administered 2015-07-05: 30 mg via INTRATRACHEAL

## 2015-07-05 MED ORDER — SODIUM CHLORIDE 0.9% FLUSH: 3.0000 mL | INTRAVENOUS | Status: DC | PRN

## 2015-07-05 MED ORDER — SODIUM CHLORIDE 0.9 % IV SOLN: 250.0000 mL | INTRAVENOUS | Status: DC

## 2015-07-05 MED ORDER — LIDOCAINE VISCOUS 2 % MT SOLN
OROMUCOSAL | Status: DC | PRN
  Administered 2015-07-05: 1 via OROMUCOSAL

## 2015-07-05 MED ORDER — SODIUM CHLORIDE 0.9% FLUSH: 3.0000 mL | Freq: Two times a day (BID) | INTRAVENOUS | Status: DC

## 2015-07-05 MED ORDER — LIDOCAINE VISCOUS 2 % MT SOLN
OROMUCOSAL | Status: AC
  Filled 2015-07-05: qty 15

## 2015-07-05 NOTE — Progress Notes (Signed)
Echocardiogram Echocardiogram Transesophageal has been performed.  Kurt Young 07/05/2015, 2:13 PM

## 2015-07-05 NOTE — Transfer of Care (Signed)
Immediate Anesthesia Transfer of Care Note  Patient: Kurt Young, DDS  Procedure(s) Performed: Procedure(s): TRANSESOPHAGEAL ECHOCARDIOGRAM (TEE) (N/A) CARDIOVERSION (N/A)  Patient Location: Endoscopy Unit  Anesthesia Type:MAC  Level of Consciousness: awake, alert  and oriented  Airway & Oxygen Therapy: Patient Spontanous Breathing and Patient connected to nasal cannula oxygen  Post-op Assessment: Report given to RN, Post -op Vital signs reviewed and stable and Patient moving all extremities X 4  Post vital signs: Reviewed and stable  Last Vitals:  Filed Vitals:   07/05/15 1321 07/05/15 1422  BP: 102/97   Pulse: 121 98  Temp: 36.7 C   Resp: 21 15    Last Pain: There were no vitals filed for this visit.       Complications: No apparent anesthesia complications

## 2015-07-05 NOTE — Interval H&P Note (Signed)
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 After consideration of risks, benefits and other options for treatment, the patient has consented to  Procedure(s): TRANSESOPHAGEAL ECHOCARDIOGRAM (TEE) (N/A) CARDIOVERSION (N/A) as a surgical intervention .  The patient's history has been reviewed, patient examined, no change in status, stable for surgery.  I have reviewed the patient's chart and labs.  Questions were answered to the patient's satisfaction.     Dorris Carnes

## 2015-07-05 NOTE — CV Procedure (Signed)
LA, LAA without masses TV normal AV is normal MV is normal  Trace MR PV is normal LVEF is grossly normal PFO present by color doppler and with injection of agitated saline with bubbles seen in LA Minimal atherosclerotic plaquing in the thoracic aorta   Full report to follow

## 2015-07-05 NOTE — Interval H&P Note (Signed)
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 After consideration of risks, benefits and other options for treatment, the patient has consented to  Procedure(s): TRANSESOPHAGEAL ECHOCARDIOGRAM (TEE) (N/A) CARDIOVERSION (N/A) as a surgical intervention .  The patient's history has been reviewed, patient examined, no change in status, stable for surgery.  I have reviewed the patient's chart and labs.  Questions were answered to the patient's satisfaction.     Dorris Carnes

## 2015-07-05 NOTE — Discharge Instructions (Signed)
Electrical Cardioversion, Care After °Refer to this sheet in the next few weeks. These instructions provide you with information on caring for yourself after your procedure. Your health care provider may also give you more specific instructions. Your treatment has been planned according to current medical practices, but problems sometimes occur. Call your health care provider if you have any problems or questions after your procedure. °WHAT TO EXPECT AFTER THE PROCEDURE °After your procedure, it is typical to have the following sensations: °· Some redness on the skin where the shocks were delivered. If this is tender, a sunburn lotion or hydrocortisone cream may help. °· Possible return of an abnormal heart rhythm within hours or days after the procedure. °HOME CARE INSTRUCTIONS °· Take medicines only as directed by your health care provider. Be sure you understand how and when to take your medicine. °· Learn how to feel your pulse and check it often. °· Limit your activity for 48 hours after the procedure or as directed by your health care provider. °· Avoid or minimize caffeine and other stimulants as directed by your health care provider. °SEEK MEDICAL CARE IF: °· You feel like your heart is beating too fast or your pulse is not regular. °· You have any questions about your medicines. °· You have bleeding that will not stop. °SEEK IMMEDIATE MEDICAL CARE IF: °· You are dizzy or feel faint. °· It is hard to breathe or you feel short of breath. °· There is a change in discomfort in your chest. °· Your speech is slurred or you have trouble moving an arm or leg on one side of your body. °· You get a serious muscle cramp that does not go away. °· Your fingers or toes turn cold or blue. °  °This information is not intended to replace advice given to you by your health care provider. Make sure you discuss any questions you have with your health care provider. °  °Document Released: 12/04/2012 Document Revised: 03/06/2014  Document Reviewed: 12/04/2012 °Elsevier Interactive Patient Education ©2016 [employer] °Transesophageal Echocardiogram °Transesophageal echocardiography (TEE) is a picture test of your heart using sound waves. The pictures taken can give very detailed pictures of your heart. This can help your doctor see if there are problems with your heart. TEE can check: °· If your heart has blood clots in it. °· How well your heart valves are working. °· If you have an infection on the inside of your heart. °· Some of the major arteries of your heart. °· If your heart valve is working after a repair. °· Your heart before a procedure that uses a shock to your heart to get the rhythm back to normal. °BEFORE THE PROCEDURE °· Do not eat or drink for 6 hours before the procedure or as told by your doctor. °· Make plans to have someone drive you home after the procedure. Do not drive yourself home. °· An IV tube will be put in your arm. °PROCEDURE °· You will be given a medicine to help you relax (sedative). It will be given through the IV tube. °· A numbing medicine will be sprayed or gargled in the back of your throat to help numb it. °· The tip of the probe is placed into the back of your mouth. You will be asked to swallow. This helps to pass the probe into your esophagus. °· Once the tip of the probe is in the right place, your doctor can take pictures of your heart. °· You   may feel pressure at the back of your throat. °AFTER THE PROCEDURE °· You will be taken to a recovery area so the sedative can wear off. °· Your throat may be sore and scratchy. This will go away slowly over time. °· You will go home when you are fully awake and able to swallow liquids. °· You should have someone stay with you for the next 24 hours. °· Do not drive or operate machinery for the next 24 hours. °  °This information is not intended to replace advice given to you by your health care provider. Make sure you discuss any questions you have with  your health care provider. °  °Document Released: 12/11/2008 Document Revised: 02/18/2013 Document Reviewed: 08/15/2012 °Elsevier Interactive Patient Education ©2016 [employer] ° °

## 2015-07-05 NOTE — Anesthesia Postprocedure Evaluation (Signed)
Anesthesia Post Note  Patient: Kurt Young, DDS  Procedure(s) Performed: Procedure(s) (LRB): TRANSESOPHAGEAL ECHOCARDIOGRAM (TEE) (N/A) CARDIOVERSION (N/A)  Patient location during evaluation: PACU Anesthesia Type: MAC Level of consciousness: awake, awake and alert, oriented and patient cooperative Pain management: satisfactory to patient Vital Signs Assessment: post-procedure vital signs reviewed and stable Respiratory status: spontaneous breathing and respiratory function stable Cardiovascular status: stable Anesthetic complications: no    Last Vitals:  Filed Vitals:   07/05/15 1321 07/05/15 1422  BP: 102/97 99/76  Pulse: 121 98  Temp: 36.7 C   Resp: 21 15    Last Pain: There were no vitals filed for this visit.               Amit Meloy EDWARD

## 2015-07-05 NOTE — Anesthesia Preprocedure Evaluation (Signed)
Anesthesia Evaluation  Patient identified by MRN, date of birth, ID band Patient awake    Reviewed: Allergy & Precautions, NPO status , Patient's Chart, lab work & pertinent test results, reviewed documented beta blocker date and time   History of Anesthesia Complications (+) PONV and history of anesthetic complications  Airway Mallampati: II  TM Distance: >3 FB Neck ROM: Full    Dental  (+) Teeth Intact, Dental Advisory Given   Pulmonary neg pulmonary ROS,    Pulmonary exam normal breath sounds clear to auscultation       Cardiovascular hypertension, Pt. on medications and Pt. on home beta blockers (-) angina(-) Past MI + dysrhythmias Atrial Fibrillation  Rhythm:Irregular Rate:Abnormal     Neuro/Psych negative neurological ROS  negative psych ROS   GI/Hepatic negative GI ROS, Neg liver ROS,   Endo/Other  negative endocrine ROS  Renal/GU negative Renal ROS     Musculoskeletal  (+) Arthritis , Osteoarthritis,    Abdominal   Peds  Hematology negative hematology ROS (+)   Anesthesia Other Findings Day of surgery medications reviewed with the patient.  Reproductive/Obstetrics                             Anesthesia Physical Anesthesia Plan  ASA: III  Anesthesia Plan: MAC   Post-op Pain Management:    Induction: Intravenous  Airway Management Planned: Nasal Cannula  Additional Equipment:   Intra-op Plan:   Post-operative Plan:   Informed Consent: I have reviewed the patients History and Physical, chart, labs and discussed the procedure including the risks, benefits and alternatives for the proposed anesthesia with the patient or authorized representative who has indicated his/her understanding and acceptance.   Dental advisory given  Plan Discussed with: CRNA and Anesthesiologist  Anesthesia Plan Comments: (Discussed risks/benefits/alternatives to MAC sedation including need  for ventilatory support, hypotension, need for conversion to general anesthesia.  All patient questions answered.  Patient/guardian wishes to proceed.)        Anesthesia Quick Evaluation

## 2015-07-05 NOTE — Op Note (Signed)
Pt anesthetized with propofol and lidocaine With pads in AP position, pt cardioverted to SR with 200 J synchronized biphasic energy Procedure without complication 12 lead EKG pending .

## 2015-07-05 NOTE — H&P (View-Only) (Signed)
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 Has been feeling short of breath especially with exertional activity such as cutting grass.[name] Heart rate in clinic on 06/30/15 is 132 bpm with typical appearing atrial flutter pattern, negative deflection in lead 23 aVF and positive in V1. Variable conduction. He is not currently on anticoagulation.  On 06/11/15 - mowing with push mower, light headed, dizzy. Heart was racing, pounding. Over the past year he has noted frequent bouts of palpitations that originally started for a few seconds duration than lasting minutes then hours then days.  Normal rate 60's. 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   Father was recommended pacer, cardac arrest in snow.   Non smoker. Bronchitis 2 weeks before this recent episode.  Mild coffee. No significant alcohol use. No stimulants, no Sudafed.  BPH -prior bladder bleeding episode. Dr. Gaynelle Arabian. No HTN.  When lay on left side heart palps. No orthopnea. No diarrhea. No DM.   Currently he is comfortable sitting in front of me, no increased work of breathing. He does note however when trying to exert himself he feels short of breath especially with his current heart rate.  Past Medical History  Diagnosis Date  . DJD (degenerative joint disease)   . Skin cancer     melanoma X 2; squamous cell X 1  . Urethral stricture 2010    Dr Hartley Barefoot    Past Surgical History  Procedure Laterality Date  . Mohs surgery      X 2 of back  . Tonsillectomy    . Appendectomy    . Inguinal hernia repair Left   . Total knee arthroplasty  2010    Dr Noemi Chapel  . Colonoscopy  2002    negative    Current Medications: Outpatient  Prescriptions Prior to Visit  Medication Sig Dispense Refill  . Multiple Vitamin (MULTIVITAMIN) tablet Take 1 tablet by mouth daily.      [name] Propylene Glycol-Glycerin (MOISTURE EYES) 1-0.3 % SOLN Apply 1 drop to eye at bedtime as needed.      . tamsulosin (FLOMAX) 0.4 MG CAPS capsule Take 0.4 mg by mouth daily.   11  . vitamin C (ASCORBIC ACID) 500 MG tablet Take 500 mg by mouth daily.      . vitamin E (VITAMIN E) 400 UNIT capsule Take 400 Units by mouth daily.      . fish oil-omega-3 fatty acids 1000 MG capsule Take 1 g by mouth daily.      [name] albuterol (VENTOLIN HFA) 108 (90 Base) MCG/ACT inhaler Inhale 1-2 puffs into the lungs every 4 (four) hours as needed for wheezing or shortness of breath. 1 Inhaler 0  . azithromycin (ZITHROMAX) 250 MG tablet 500 mg day 1, then 250 mg daily (Patient not taking: Reported on 05/22/2015) 6 tablet 0  . chlorpheniramine-HYDROcodone (TUSSIONEX PENNKINETIC ER) 10-8 MG/5ML SUER Take 5 mLs by mouth 2 (two) times daily. 115 mL 0  . clarithromycin (BIAXIN) 500 MG tablet Take 1 tablet (500 mg total) by mouth 2 (two) times daily. 20 tablet 0  . predniSONE (DELTASONE) 20 MG tablet 2 tabs po qd x 4d, then 1 tab po  qd x 5d, then 1/2 tab po qd x 4d 15 tablet 0   No facility-administered medications prior to visit.     Allergies:   Codeine and Morphine and related   Social History   Social History  . Marital Status: Divorced    Spouse Name: N/A  . Number of Children: 2  . Years of Education: N/A   Occupational History  . Dentist    Social History Main Topics  . Smoking status: Never Smoker   . Smokeless tobacco: None  . Alcohol Use: 1.2 oz/week    2 Glasses of wine per week     Comment:    . Drug Use: No  . Sexual Activity: Not Asked   Other Topics Concern  . None   Social History Narrative   Divorced, 2 sons and 1 daughter.   Lives in [place].   Occupation: Pharmacist, community in [place], [place].   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   No tobacco, no alchol, no  drugs.   Raises pigeons     Family History:  The patient's family history includes Diabetes in his brother; Heart disease (age of onset: 35) in his father.   ROS:   Please see the history of present illness.   Excessive sweating, fatigue, skipped heartbeats, dizziness, cough, shortness of breath with activity ROS All other systems reviewed and are negative.   PHYSICAL EXAM:   VS:  BP 110/68 mmHg  Pulse 132  Ht 6\' 7"  (2.007 m)  Wt 223 lb 9.6 oz (101.424 kg)  BMI 25.18 kg/m2   GEN: Well nourished, well developed, tall in no acute distress HEENT: normal Neck: no JVD, carotid bruits, or masses Cardiac: Irreg; tachy, no murmurs, rubs, or gallops,no edema  Respiratory:  clear to auscultation bilaterally, normal work of breathing GI: soft, nontender, nondistended, + BS MS: no deformity or atrophy Skin: warm and dry, no rash Neuro:  Alert and Oriented x 3, Strength and sensation are intact Psych: euthymic mood, full affect  Wt Readings from Last 3 Encounters:  06/30/15 223 lb 9.6 oz (101.424 kg)  05/22/15 218 lb (98.884 kg)  05/14/15 226 lb 8 oz (102.74 kg)      Studies/Labs Reviewed:   EKG:  EKG is ordered today. 06/30/15-heart rate 132 bpm with atrial flutter, typical appearance.  Recent Labs: No results found for requested labs within last 365 days.   Lipid Panel    Component Value Date/Time   CHOL 100 01/27/2011 1450   TRIG 70 01/27/2011 1450   HDL 55 01/27/2011 1450   CHOLHDL 1.8 01/27/2011 1450   VLDL 14 01/27/2011 1450   LDLCALC 31 01/27/2011 1450    Additional studies/ records that were reviewed today include:   EKGs reviewed.    ASSESSMENT:    1. Atrial flutter, unspecified type ([place])   2. Abnormal EKG   3. Chronic fatigue      PLAN:  In order of problems listed above:  Atrial flutter -Newly discovered 06/30/15. He's had potentially 3 weeks of consistent flutter at 130 bpm however. Asymptomatic. Feeling short of breath with activity. I'm concerned  about the possibility of tachycardia-induced cardiomyopathy and would like to go ahead and schedule him for TEE cardioversion. Risks and benefits of procedure discussed. -We will start Xarelto 20 mg at night. He will stop his tumeric and fish oil supplements. Understands risks of bleeding, discussed stroke risk. -CHADSVASC - 1 (age) but don't know if CHF. -We will also start metoprolol tartrate 25  mg twice a day. Hopefully this will help with rate control although atrial flutter is very challenging to rate control. -If symptoms worsen or become more worrisome he is to contact us. -We are going to check TSH, free T4, complete metabolic profile and CBC. -He did have a bout of bronchitis which may have triggered this recent bout although he seems to have had a natural course of more and more duration of paroxysmal atrial flutter over the last year. -We will see him back soon after cardioversion.  Dyspnea on exertion -Secondary to atrial flutter. Hopefully he does not have a tachycardia-induced cardio myopathy.  BPH -He previously had some bleeding in his bladder. Dr. Gaynelle Arabian has performed thorough workup. He has not had any recent bleeding despite taking fish oil as well as tumoric.  Finasteride has helped him significant with his prostate.    Medication Adjustments/Labs and Tests Ordered: Current medicines are reviewed at length with the patient today.  Concerns regarding medicines are outlined above.  Medication changes, Labs and Tests ordered today are listed in the Patient Instructions below. Patient Instructions  Medication Instructions:  1. START XARELTO 20 MG DAILY AT SUPPER; START TODAY  2. START METOPROLOL TARTRATE 25 MG 1 TABLET TWICE DAILY  Labwork: 1. TODAY CMET, CBC, TSH, FREE T4  Testing/Procedures: Your physician has requested that you have a TEE/Cardioversion. During a TEE, sound waves are used to create images of your heart. It provides your doctor with information about  the size and shape of your heart and how well your heart's chambers and valves are working. In this test, a transducer is attached to the end of a flexible tube that is guided down you throat and into your esophagus (the tube leading from your mouth to your stomach) to get a more detailed image of your heart. Once the TEE has determined that a blood clot is not present, the cardioversion begins. Electrical Cardioversion uses a jolt of electricity to your heart either through paddles or wired patches attached to your chest. This is a controlled, usually prescheduled, procedure. This procedure is done at the hospital and you are not awake during the procedure. You usually go home the day of the procedure. Please see the instruction sheet given to you today for more information.    Follow-Up: 07/23/15 @ 3:45 WITH DR. Marlou Porch S/P TEE/DCCV F/U APPT  Any Other Special Instructions Will Be Listed Below (If Applicable).     If you need a refill on your cardiac medications before your next appointment, please call your pharmacy.       Bobby Rumpf, MD  06/30/2015 3:23 PM    [facility] [address], [place], [place]  [address] Phone: [phone]; Fax: [phone]

## 2015-07-06 ENCOUNTER — Encounter (HOSPITAL_COMMUNITY): Payer: Self-pay | Admitting: Internal Medicine

## 2015-07-07 ENCOUNTER — Telehealth: Payer: Self-pay | Admitting: Cardiology

## 2015-07-07 NOTE — Telephone Encounter (Signed)
Left message on private VM to continue Xarelto.  Requested he call back with questions or concerns.

## 2015-07-07 NOTE — Telephone Encounter (Signed)
New message    Pt calling about their blood thinner if the need to continue, Xarelto. Please call back at number provided.

## 2015-07-09 NOTE — Telephone Encounter (Signed)
Pt aware he does need to continue his both his beta blocker and Xarelto.  He is aware of his upcoming appt 07/23/15.

## 2015-07-23 ENCOUNTER — Ambulatory Visit (INDEPENDENT_AMBULATORY_CARE_PROVIDER_SITE_OTHER): Payer: Medicare Other | Admitting: Cardiology

## 2015-07-23 ENCOUNTER — Encounter: Payer: Self-pay | Admitting: Cardiology

## 2015-07-23 ENCOUNTER — Ambulatory Visit: Payer: Medicare Other | Admitting: Cardiology

## 2015-07-23 VITALS — BP 118/70 | HR 66 | Ht >= 80 in | Wt 217.0 lb

## 2015-07-23 DIAGNOSIS — I4892 Unspecified atrial flutter: Secondary | ICD-10-CM | POA: Diagnosis not present

## 2015-07-23 NOTE — Progress Notes (Signed)
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  Underwent successful TEE cardioversion on 07/05/15. EF was normal.   Previous symptoms and felt short of breath with exertion, cutting grass, heart rate 132 bpm. Heart rate in clinic on 06/30/15 is 132 bpm with typical appearing atrial flutter pattern, negative deflection in lead 23 aVF and positive in V1. Variable conduction.  On 06/11/15 - mowing with push mower, light headed, dizzy. Heart was racing, pounding. Over the past year he has noted frequent bouts of palpitations that originally started for a few seconds duration than lasting minutes then hours then days.  Normal rate 60's. 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   Father was recommended pacer, cardac arrest in snow.   Non smoker. Bronchitis 2 weeks before this recent episode.  Mild coffee. No significant alcohol use. No stimulants, no Sudafed.  BPH -prior bladder bleeding episode. Dr. Gaynelle Arabian. No HTN.  When lay on left side heart palps. No orthopnea. No diarrhea. No DM.     Past Medical History  Diagnosis Date  . DJD (degenerative joint disease)   . Skin cancer     melanoma X 2; squamous cell X 1  . Urethral stricture 2010    Dr Hartley Barefoot  . Atrial flutter with rapid ventricular response [facility])     Past Surgical History  Procedure Laterality Date  . Mohs surgery      X 2 of back  . Tonsillectomy    . Appendectomy    . Inguinal hernia repair Left   . Total knee arthroplasty  2010    Dr Noemi Chapel  . Colonoscopy  2002    negative  . Tee without cardioversion N/A 07/05/2015    PFO noted.  Procedure: TRANSESOPHAGEAL ECHOCARDIOGRAM (TEE);  Surgeon: Fay Records, MD;  Location: [facility] ENDOSCOPY;   Service: Cardiovascular;  Laterality: N/A;  . Cardioversion N/A 07/05/2015    Procedure: CARDIOVERSION;  Surgeon: Fay Records, MD;  Location: [facility] ENDOSCOPY;  Service: Cardiovascular;  Laterality: N/A;    Current Medications: Outpatient Prescriptions Prior to Visit  Medication Sig Dispense Refill  . Ascorbic Acid (VITAMIN C) 1000 MG tablet Take 1,000 mg by mouth 2 (two) times daily.    . cholecalciferol (VITAMIN D) 1000 units tablet Take 1,000 Units by mouth daily.    . Cyanocobalamin (VITAMIN B-12 PO) Take 1 tablet by mouth daily.    . IRON PO Take 1 tablet by mouth daily.    [name] MAGNESIUM PO Take 1 tablet by mouth daily.    . metoprolol tartrate (LOPRESSOR) 25 MG tablet Take 1 tablet (25 mg total) by mouth 2 (two) times daily. 180 tablet 3  . Multiple Vitamin (MULTIVITAMIN WITH MINERALS) TABS tablet Take 1 tablet by mouth daily.    . Omega-3 Fatty Acids (FISH OIL) 1000 MG CAPS Take 1,000 mg by mouth daily.    [name] OVER THE COUNTER MEDICATION Place 1 drop into both eyes 2 (two) times daily. Over the counter lubricating eye drop    . rivaroxaban (XARELTO) 20 MG TABS tablet Take 1 tablet (20 mg total) by mouth daily with supper. 90 tablet 3  . tamsulosin (FLOMAX) 0.4 MG  CAPS capsule Take 0.4 mg by mouth at bedtime.   11  . TURMERIC PO Take 2 capsules by mouth daily.    . vitamin E (VITAMIN E) 400 UNIT capsule Take 400 Units by mouth daily.       No facility-administered medications prior to visit.     Allergies:   Codeine and Morphine and related   Social History   Social History  . Marital Status: Divorced    Spouse Name: N/A  . Number of Children: 2  . Years of Education: N/A   Occupational History  . Dentist    Social History Main Topics  . Smoking status: Never Smoker   . Smokeless tobacco: None  . Alcohol Use: 1.2 oz/week    2 Glasses of wine per week     Comment:    . Drug Use: No  . Sexual Activity: Not Asked   Other Topics Concern  . None   Social History Narrative    Divorced, 2 sons and 1 daughter.   Lives in [place].   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   No tobacco, no alchol, no drugs.   Raises pigeons     Family History:  The patient's family history includes Diabetes in his brother; Heart disease (age of onset: 71) in his father.   ROS:   Please see the history of present illness.   Excessive sweating, fatigue, skipped heartbeats, dizziness, cough, shortness of breath with activity ROS All other systems reviewed and are negative.   PHYSICAL EXAM:   VS:  BP 118/70 mmHg  Pulse 66  Ht 6\' 8"  (2.032 m)  Wt 217 lb (98.431 kg)  BMI 23.84 kg/m2   GEN: Well nourished, well developed, tall in no acute distress HEENT: normal Neck: no JVD, carotid bruits, or masses Cardiac: Irreg; tachy, no murmurs, rubs, or gallops,no edema  Respiratory:  clear to auscultation bilaterally, normal work of breathing GI: soft, nontender, nondistended, + BS MS: no deformity or atrophy Skin: warm and dry, no rash Neuro:  Alert and Oriented x 3, Strength and sensation are intact Psych: euthymic mood, full affect  Wt Readings from Last 3 Encounters:  07/23/15 217 lb (98.431 kg)  07/05/15 223 lb (101.152 kg)  06/30/15 223 lb 9.6 oz (101.424 kg)      Studies/Labs Reviewed:   EKG:  EKG is ordered today. 06/30/15-heart rate 132 bpm with atrial flutter, typical appearance.  Recent Labs: 06/30/2015: ALT 21; BUN 15; Creat 0.97; Hemoglobin 13.0*; Platelets 268; Potassium 4.0; Sodium 139; TSH 1.31   Lipid Panel    Component Value Date/Time   CHOL 100 01/27/2011 1450   TRIG 70 01/27/2011 1450   HDL 55 01/27/2011 1450   CHOLHDL 1.8 01/27/2011 1450   VLDL 14 01/27/2011 1450   LDLCALC 31 01/27/2011 1450    Additional studies/ records that were reviewed today include:   EKGs reviewed.EKG was ordered today 5/26 and 17-sinus rhythm, 63, no other abnormalities. Personally viewed.    ASSESSMENT:    1. Atrial flutter, unspecified  type ([place])      PLAN:  In order of problems listed above:  Atrial flutter -Successful TEE cardioversion on 07/05/15. -Newly discovered 06/30/15. He's had potentially 3 weeks of consistent flutter at 130 bpm however. Asymptomatic. Feeling short of breath with activity.   Xarelto 20 mg at night. He will stop his tumeric and fish oil supplements. Understands risks of bleeding, discussed stroke risk. -CHADSVASC -  1 (age) -We will also start metoprolol tartrate 25 mg twice a day. Hopefully this will help with rate control although atrial flutter is very challenging to rate control. -TSH, free T4, complete metabolic profile and CBC were normal. -He did have a bout of bronchitis which may have triggered this recent bout although he seems to have had a natural course of more and more duration of paroxysmal atrial flutter over the last year. -We discussed starting or stopping Xarelto and pros and cons, risk or of 1. He would like to come off of this for weeks after his cardioversion. This is reasonable secondary to her guidelines. If atrial flutter were to occur once again, we would need to restart anticoagulation and refer for ablative therapy since he has a typical flutter. He is also concerned about continuing anti-coag ablation because of his prior hematuria.  Dyspnea on exertion -Secondary to atrial flutter.   BPH -He previously had some bleeding in his bladder. Dr. Gaynelle Arabian has performed thorough workup.  Finasteride has helped him significant with his prostate.    Medication Adjustments/Labs and Tests Ordered: Current medicines are reviewed at length with the patient today.  Concerns regarding medicines are outlined above.  Medication changes, Labs and Tests ordered today are listed in the Patient Instructions below. Patient Instructions  Medication Instructions:  You discontinue your Xarelto after 08/05/2015. Continue all other medications as listed.  Follow-Up: Follow up in 6 months with  Dr. Marlou Porch.  You will receive a letter in the mail 2 months before you are due.  Please call us when you receive this letter to schedule your follow up appointment.  If you need a refill on your cardiac medications before your next appointment, please call your pharmacy.  Thank you for choosing [facility]!!         Signed, Candee Furbish, MD  07/23/2015 1:22 PM    [facility] [place], [place], [place]  [address] Phone: [phone]; Fax: [phone]

## 2015-07-23 NOTE — Patient Instructions (Signed)
Medication Instructions:  You discontinue your Xarelto after 08/05/2015. Continue all other medications as listed.  Follow-Up: Follow up in 6 months with Dr. Marlou Porch.  You will receive a letter in the mail 2 months before you are due.  Please call us when you receive this letter to schedule your follow up appointment.  If you need a refill on your cardiac medications before your next appointment, please call your pharmacy.  Thank you for choosing [facility]!!

## 2015-08-25 DIAGNOSIS — L988 Other specified disorders of the skin and subcutaneous tissue: Secondary | ICD-10-CM | POA: Diagnosis not present

## 2015-08-25 DIAGNOSIS — D485 Neoplasm of uncertain behavior of skin: Secondary | ICD-10-CM | POA: Diagnosis not present

## 2015-09-16 DIAGNOSIS — D1801 Hemangioma of skin and subcutaneous tissue: Secondary | ICD-10-CM | POA: Diagnosis not present

## 2015-09-16 DIAGNOSIS — L820 Inflamed seborrheic keratosis: Secondary | ICD-10-CM | POA: Diagnosis not present

## 2015-09-16 DIAGNOSIS — D485 Neoplasm of uncertain behavior of skin: Secondary | ICD-10-CM | POA: Diagnosis not present

## 2015-09-24 ENCOUNTER — Encounter: Payer: Self-pay | Admitting: Family Medicine

## 2015-09-24 ENCOUNTER — Ambulatory Visit (INDEPENDENT_AMBULATORY_CARE_PROVIDER_SITE_OTHER): Payer: Medicare Other | Admitting: Family Medicine

## 2015-09-24 VITALS — BP 101/63 | HR 67 | Temp 98.0°F | Resp 16 | Ht >= 80 in | Wt 213.5 lb

## 2015-09-24 DIAGNOSIS — K4090 Unilateral inguinal hernia, without obstruction or gangrene, not specified as recurrent: Secondary | ICD-10-CM

## 2015-09-24 NOTE — Progress Notes (Signed)
Pre visit review using our clinic review tool, if applicable. No additional management support is needed unless otherwise documented below in the visit note. 

## 2015-09-24 NOTE — Progress Notes (Signed)
OFFICE VISIT  09/24/2015   CC:  Chief Complaint  Patient presents with  . Hernia    recheck, right side   HPI:    Patient is a 70 y.o. Caucasian male who presents for enlarging R inguinal hernia, becoming more uncomfortable esp when standing long periods.  It bulges out but with gentle pressure it will go back in w/out problem.  No scrotal mass.  Past Medical History:  Diagnosis Date  . Atrial flutter with rapid ventricular response ([place])   . DJD (degenerative joint disease)   . Skin cancer    melanoma X 2; squamous cell X 1  . Urethral stricture 2010   Dr Hartley Barefoot    Past Surgical History:  Procedure Laterality Date  . APPENDECTOMY    . CARDIOVERSION N/A 07/05/2015   Procedure: CARDIOVERSION;  Surgeon: Fay Records, MD;  Location[facility] ENDOSCOPY;  Service: Cardiovascular;  Laterality: N/A;  . COLONOSCOPY  2002   negative  . INGUINAL HERNIA REPAIR Left   . MOHS SURGERY     X 2 of back  . TEE WITHOUT CARDIOVERSION N/A 07/05/2015   PFO noted.  Procedure: TRANSESOPHAGEAL ECHOCARDIOGRAM (TEE);  Surgeon: Fay Records, MD;  Location: [facility] ENDOSCOPY;  Service: Cardiovascular;  Laterality: N/A;  . tonsillectomy    . TOTAL KNEE ARTHROPLASTY  2010   Dr Noemi Chapel    Outpatient Medications Prior to Visit  Medication Sig Dispense Refill  . Ascorbic Acid (VITAMIN C) 1000 MG tablet Take 1,000 mg by mouth 2 (two) times daily.    . cholecalciferol (VITAMIN D) 1000 units tablet Take 1,000 Units by mouth daily.    . Cyanocobalamin (VITAMIN B-12 PO) Take 1 tablet by mouth daily.    . IRON PO Take 1 tablet by mouth daily.    [name] MAGNESIUM PO Take 1 tablet by mouth daily.    . metoprolol tartrate (LOPRESSOR) 25 MG tablet Take 1 tablet (25 mg total) by mouth 2 (two) times daily. 180 tablet 3  . Multiple Vitamin (MULTIVITAMIN WITH MINERALS) TABS tablet Take 1 tablet by mouth daily.    . Omega-3 Fatty Acids (FISH OIL) 1000 MG CAPS Take 1,000 mg by mouth daily.    [name] OVER THE COUNTER MEDICATION Place 1  drop into both eyes 2 (two) times daily. Over the counter lubricating eye drop    . tamsulosin (FLOMAX) 0.4 MG CAPS capsule Take 0.4 mg by mouth at bedtime.   11  . TURMERIC PO Take 2 capsules by mouth daily.    . vitamin E (VITAMIN E) 400 UNIT capsule Take 400 Units by mouth daily.      . rivaroxaban (XARELTO) 20 MG TABS tablet Take 1 tablet (20 mg total) by mouth daily with supper. (Patient not taking: Reported on 09/24/2015) 90 tablet 3   No facility-administered medications prior to visit.     Allergies  Allergen Reactions  . Codeine Other (See Comments)    REACTION: angioedema  . Morphine And Related Hives    ROS As per HPI  PE: Blood pressure 101/63, pulse 67, temperature 98 F (36.7 C), temperature source Oral, resp. rate 16, height 6\' 8"  (2.032 m), weight 213 lb 8 oz (96.8 kg), SpO2 97 %. Gen: Alert, well appearing.  Patient is oriented to person, place, time, and situation. AFFECT: pleasant, lucid thought and speech. GU: R sided bulge in groin region superior to inguinal ligament on R.  Testicle w/out any hernia. The bulging groin region is nontender but mildly uncomfortable to  manipulate.  It is easily reducible.   When lying supine, the hernia reduces on its own.  LABS:  none  IMPRESSION AND PLAN:  Right inguinal hernia. He is ready to get this electively repaired. Referred to gen surgery today. Signs/symptoms to call or return for were reviewed and pt expressed understanding.  An After Visit Summary was printed and given to the patient.  FOLLOW UP: Return if symptoms worsen or fail to improve.  Signed:  Crissie Sickles, MD           09/24/2015

## 2015-10-12 ENCOUNTER — Other Ambulatory Visit: Payer: Self-pay | Admitting: General Surgery

## 2015-10-12 DIAGNOSIS — Z9049 Acquired absence of other specified parts of digestive tract: Secondary | ICD-10-CM | POA: Diagnosis not present

## 2015-10-12 DIAGNOSIS — Z9889 Other specified postprocedural states: Secondary | ICD-10-CM | POA: Diagnosis not present

## 2015-10-12 DIAGNOSIS — Z96659 Presence of unspecified artificial knee joint: Secondary | ICD-10-CM | POA: Diagnosis not present

## 2015-10-12 DIAGNOSIS — Z8679 Personal history of other diseases of the circulatory system: Secondary | ICD-10-CM | POA: Diagnosis not present

## 2015-10-12 DIAGNOSIS — K4090 Unilateral inguinal hernia, without obstruction or gangrene, not specified as recurrent: Secondary | ICD-10-CM | POA: Diagnosis not present

## 2015-10-12 DIAGNOSIS — Z8719 Personal history of other diseases of the digestive system: Secondary | ICD-10-CM | POA: Diagnosis not present

## 2015-10-13 ENCOUNTER — Encounter: Payer: Self-pay | Admitting: Family Medicine

## 2015-10-22 DIAGNOSIS — H01009 Unspecified blepharitis unspecified eye, unspecified eyelid: Secondary | ICD-10-CM | POA: Diagnosis not present

## 2015-11-15 DIAGNOSIS — H2513 Age-related nuclear cataract, bilateral: Secondary | ICD-10-CM | POA: Diagnosis not present

## 2015-11-15 DIAGNOSIS — H04123 Dry eye syndrome of bilateral lacrimal glands: Secondary | ICD-10-CM | POA: Diagnosis not present

## 2015-11-15 DIAGNOSIS — H35363 Drusen (degenerative) of macula, bilateral: Secondary | ICD-10-CM | POA: Diagnosis not present

## 2015-11-17 ENCOUNTER — Encounter (HOSPITAL_BASED_OUTPATIENT_CLINIC_OR_DEPARTMENT_OTHER): Payer: Self-pay | Admitting: *Deleted

## 2015-11-19 ENCOUNTER — Encounter (HOSPITAL_BASED_OUTPATIENT_CLINIC_OR_DEPARTMENT_OTHER)
Admission: RE | Admit: 2015-11-19 | Discharge: 2015-11-19 | Disposition: A | Discharge status: Home / Self Care | Payer: Medicare Other | Source: Ambulatory Visit | Attending: General Surgery | Admitting: General Surgery

## 2015-11-19 DIAGNOSIS — K4090 Unilateral inguinal hernia, without obstruction or gangrene, not specified as recurrent: Secondary | ICD-10-CM | POA: Insufficient documentation

## 2015-11-19 DIAGNOSIS — Z01818 Encounter for other preprocedural examination: Secondary | ICD-10-CM | POA: Diagnosis not present

## 2015-11-19 LAB — CBC WITH DIFFERENTIAL/PLATELET
BASOS ABS: 0.1 10*3/uL (ref 0.0–0.1)
BASOS PCT: 2 %
EOS ABS: 0.2 10*3/uL (ref 0.0–0.7)
Eosinophils Relative: 3 %
HCT: 40.1 % (ref 39.0–52.0)
HEMOGLOBIN: 13.2 g/dL (ref 13.0–17.0)
Lymphocytes Relative: 19 %
Lymphs Abs: 1 10*3/uL (ref 0.7–4.0)
MCH: 29.7 pg (ref 26.0–34.0)
MCHC: 32.9 g/dL (ref 30.0–36.0)
MCV: 90.1 fL (ref 78.0–100.0)
Monocytes Absolute: 0.3 10*3/uL (ref 0.1–1.0)
Monocytes Relative: 5 %
NEUTROS ABS: 3.5 10*3/uL (ref 1.7–7.7)
NEUTROS PCT: 71 %
Platelets: 343 10*3/uL (ref 150–400)
RBC: 4.45 MIL/uL (ref 4.22–5.81)
RDW: 13.1 % (ref 11.5–15.5)
WBC: 5 10*3/uL (ref 4.0–10.5)

## 2015-11-19 LAB — COMPREHENSIVE METABOLIC PANEL
ALBUMIN: 3.5 g/dL (ref 3.5–5.0)
ALK PHOS: 57 U/L (ref 38–126)
ALT: 40 U/L (ref 17–63)
ANION GAP: 6 (ref 5–15)
AST: 26 U/L (ref 15–41)
BUN: 17 mg/dL (ref 6–20)
CALCIUM: 9.5 mg/dL (ref 8.9–10.3)
CO2: 28 mmol/L (ref 22–32)
CREATININE: 0.97 mg/dL (ref 0.61–1.24)
Chloride: 102 mmol/L (ref 101–111)
GFR calc Af Amer: >60 mL/min (ref >60)
GFR calc non Af Amer: >60 mL/min (ref >60)
GLUCOSE: 138 mg/dL — AB (ref 65–99)
Potassium: 5.2 mmol/L — ABNORMAL HIGH (ref 3.5–5.1)
SODIUM: 136 mmol/L (ref 135–145)
Total Bilirubin: 0.5 mg/dL (ref 0.3–1.2)
Total Protein: 7 g/dL (ref 6.5–8.1)

## 2015-11-21 NOTE — H&P (Signed)
Norco [name] Location: [facility] Patient #: [id number] DOB: 04-Apr-1945 Divorced / Language: [name] / Race: White Male        History of Present Illness .  The patient is a 70 year old male who presents with an inguinal hernia. This is a very pleasant 70 year old gentleman. 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 He is referred by Dr. Ricardo Jericho for evaluation and surgical management of a symptomatic right inguinal hernia.  He underwent open repair of left inguinal hernia with mesh by Dr. Deon Pilling at least 15 years ago. He has done well with that. He has a two-month history of a bulge in his right groin and it has now become painful. 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 No history of incarceration. He's lost 30 pounds intentionally and noticed a hernia after he lost the weight. He is motivated to have this repaired electively  Comorbidities include open appendectomy when he was younger. Fairly significant incision. History of atrial fibrillation requiring cardioversion in May 2017. Was also relative and is now off. Left total knee replacement by Dr. Para March. Has had skin cancers. He takes lots of vitamins and supplements including omega-3 fatty acid and I told him to stop all of this for about a week before the surgery. He takes Flomax. He takes low-dose Lopressor. He is pretty healthy and physically active. 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  He is single. He has a son nearby they can stay with him overnight.  I discussed the indications, details, techniques, and numerous risk of open right inguinal hernia repair with mesh. He is aware the risk of bleeding, infection, recurrence, nerve damage with chronic pain or numbness, injury to the  testicle or bladder or intestine with major reconstructive surgery, although rare. He understands all of these issues. We talked about return to work and exercise. He understands all these issues. All his questions were answered. He agrees with this plan. We will schedule at his convenience.   Other Problems  Enlarged Prostate Inguinal Hernia Melanoma Other disease, cancer, significant illness  Past Surgical History  Appendectomy Knee Surgery Left. Open Inguinal Hernia Surgery Left. Tonsillectomy  Diagnostic Studies History  Colonoscopy >10 years ago  Allergies  Codeine Sulfate *ANALGESICS - OPIOID* Morphine Sulfate *ANALGESICS - OPIOID*  Medication History  Metoprolol Tartrate (25MG  Tablet, Oral) Active. Tamsulosin HCl (0.4MG  Capsule, Oral) Active. Vitamin C (1000MG  Tablet, Oral) Active. Vitamin D (Cholecalciferol) (1000UNIT Capsule, Oral) Active. Cyanocobalamin (1000MCG Tablet, Oral) Active. Iron (Ferrous Gluconate) (325MG  Tablet, Oral) Active. Magnesium (100MG  Capsule, Oral) Active. Multivitamin Adult (Oral) Active. Omega 3 (1000MG  Capsule, Oral) Active. Turmeric Curcumin (Oral) Active. Medications Reconciled  Social History  Alcohol use Occasional alcohol use. Caffeine use Coffee, Tea. No drug use Tobacco use Never smoker.  [place] Arthritis Mother. Diabetes Mellitus Brother.    Review of Systems  General Not Present- Appetite Loss, Chills, Fatigue, Fever, Night Sweats, Weight Gain and Weight Loss. Skin Not Present- Change in Wart/Mole, Dryness, Hives, Jaundice, New Lesions, Non-Healing Wounds, Rash and Ulcer. HEENT Not Present- Earache, Hearing Loss, Hoarseness, Nose Bleed, Oral Ulcers, Ringing in the Ears, Seasonal Allergies, Sinus Pain, Sore Throat, Visual Disturbances, Wears glasses/contact lenses and Yellow Eyes. Breast Not Present- Breast Mass, Breast Pain, Nipple Discharge and  Skin Changes. Cardiovascular Not Present- Chest  Pain, Difficulty Breathing Lying Down, Leg Cramps, Palpitations, Rapid Heart Rate, Shortness of Breath and Swelling of Extremities. Gastrointestinal Not Present- Abdominal Pain, Bloating, Bloody Stool, Change in Bowel Habits, Chronic diarrhea, Constipation, Difficulty Swallowing, Excessive gas, Gets full quickly at meals, Hemorrhoids, Indigestion, Nausea, Rectal Pain and Vomiting. Male Genitourinary Present- Nocturia. Not Present- Blood in Urine, Change in Urinary Stream, Frequency, Impotence, Painful Urination, Urgency and Urine Leakage. Musculoskeletal Not Present- Back Pain, Joint Pain, Joint Stiffness, Muscle Pain, Muscle Weakness and Swelling of Extremities. Neurological Not Present- Decreased Memory, Fainting, Headaches, Numbness, Seizures, Tingling, Tremor, Trouble walking and Weakness. Psychiatric Not Present- Anxiety, Bipolar, Change in Sleep Pattern, Depression, Fearful and Frequent crying. Endocrine Not Present- Cold Intolerance, Excessive Hunger, Hair Changes, Heat Intolerance, Hot flashes and New Diabetes. Hematology Not Present- Blood Thinners, Easy Bruising, Excessive bleeding, Gland problems, HIV and Persistent Infections.  Vitals  Weight: 223.2 lb Height: 80in Body Surface Area: 2.41 m Body Mass Index: 24.52 kg/m  Temp.: 98.4F(Oral)  Pulse: 64 (Regular)  BP: 134/72 (Sitting, Left Arm, Standard)       Physical Exam  General Mental Status-Alert. General Appearance-Consistent with stated age. Hydration-Well hydrated. Voice-Normal.  Head and Neck Head-normocephalic, atraumatic with no lesions or palpable masses. Trachea-midline. Thyroid Gland Characteristics - normal size and consistency.  Eye Eyeball - Bilateral-Extraocular movements intact. Sclera/Conjunctiva - Bilateral-No scleral icterus.  Chest and Lung Exam Chest and lung exam reveals -quiet, even and easy respiratory effort with no use of accessory muscles and on  auscultation, normal breath sounds, no adventitious sounds and normal vocal resonance. Inspection Chest Wall - Normal. Back - normal.  Cardiovascular Cardiovascular examination reveals -normal heart sounds, regular rate and rhythm with no murmurs and normal pedal pulses bilaterally.  Abdomen Inspection  Inspection of the abdomen reveals: Note: Tiny umbilical hernia. Nontender. Skin - Scar - no surgical scars. Palpation/Percussion Palpation and Percussion of the abdomen reveal - Soft, Non Tender, No Rebound tenderness, No Rigidity (guarding) and No hepatosplenomegaly. Auscultation Auscultation of the abdomen reveals - Bowel sounds normal.  Male Genitourinary Note: Left inguinal scar well-healed. Hernia repair intact. Examined supine and standing. Right groin examined supine and standing. He has a small but definite right inguinal hernia. Reducible. The bulge appears to be more lateral near the internal ring. No scrotal or testicular mass.   Neurologic Neurologic evaluation reveals -alert and oriented x 3 with no impairment of recent or remote memory. Mental Status-Normal.  Musculoskeletal Normal Exam - Left-Upper Extremity Strength Normal and Lower Extremity Strength Normal. Normal Exam - Right-Upper Extremity Strength Normal and Lower Extremity Strength Normal.  Lymphatic Head & Neck  General Head & Neck Lymphatics: Bilateral - Description - Normal. Axillary  General Axillary Region: Bilateral - Description - Normal. Tenderness - Non Tender. Femoral & Inguinal  Generalized Femoral & Inguinal Lymphatics: Bilateral - Description - Normal. Tenderness - Non Tender.    Assessment & Plan RIGHT INGUINAL HERNIA (K40.90)     You have a reducible right inguinal hernia The left inguinal hernia repair is intact You have a tiny umbilical hernia, but I do not recommend surgery on that at this time since you have no symptoms  We discussed options for repair of your  right inguinal hernia with mesh. We discussed the problems with your abdominal incisions and recurrence rate We decided to proceed with elective repair of your right inguinal hernia with mesh as an open procedure We discussed the indications, techniques, and numerous risk of the  surgery We will schedule the surgery at your convenience  Please read the patient information booklet that I gave you.  HISTORY OF LEFT INGUINAL HERNIA REPAIR (Z98.890) HISTORY OF APPENDECTOMY (Z90.49) HISTORY OF ATRIAL FIBRILLATION (Z86.79) Impression: Resolved following cardioversion. Takes beta blocker. Xarelto discontinued. HX OF TOTAL KNEE REPLACEMENT, UNSPECIFIED LATERALITY [id number])   Kurt Young. Kurt Young, M.D., [facility] General and Minimally invasive Surgery Breast and Colorectal Surgery Office:   [phone] Pager:   [phone]

## 2015-11-24 ENCOUNTER — Ambulatory Visit (HOSPITAL_BASED_OUTPATIENT_CLINIC_OR_DEPARTMENT_OTHER): Payer: Medicare Other | Admitting: Anesthesiology

## 2015-11-24 ENCOUNTER — Ambulatory Visit (HOSPITAL_BASED_OUTPATIENT_CLINIC_OR_DEPARTMENT_OTHER)
Admission: RE | Admit: 2015-11-24 | Discharge: 2015-11-24 | Disposition: A | Discharge status: Home / Self Care | Payer: Medicare Other | Source: Ambulatory Visit | Attending: General Surgery | Admitting: General Surgery

## 2015-11-24 ENCOUNTER — Encounter (HOSPITAL_BASED_OUTPATIENT_CLINIC_OR_DEPARTMENT_OTHER)
Admission: RE | Disposition: A | Discharge status: Home / Self Care | Payer: Self-pay | Source: Ambulatory Visit | Attending: General Surgery

## 2015-11-24 ENCOUNTER — Encounter (HOSPITAL_BASED_OUTPATIENT_CLINIC_OR_DEPARTMENT_OTHER): Payer: Self-pay | Admitting: Anesthesiology

## 2015-11-24 DIAGNOSIS — G8918 Other acute postprocedural pain: Secondary | ICD-10-CM | POA: Diagnosis not present

## 2015-11-24 DIAGNOSIS — R109 Unspecified abdominal pain: Secondary | ICD-10-CM | POA: Diagnosis not present

## 2015-11-24 DIAGNOSIS — Z96659 Presence of unspecified artificial knee joint: Secondary | ICD-10-CM | POA: Insufficient documentation

## 2015-11-24 DIAGNOSIS — K4090 Unilateral inguinal hernia, without obstruction or gangrene, not specified as recurrent: Secondary | ICD-10-CM | POA: Insufficient documentation

## 2015-11-24 DIAGNOSIS — Z85820 Personal history of malignant melanoma of skin: Secondary | ICD-10-CM | POA: Diagnosis not present

## 2015-11-24 DIAGNOSIS — N400 Enlarged prostate without lower urinary tract symptoms: Secondary | ICD-10-CM | POA: Insufficient documentation

## 2015-11-24 DIAGNOSIS — Z79899 Other long term (current) drug therapy: Secondary | ICD-10-CM | POA: Insufficient documentation

## 2015-11-24 HISTORY — DX: Cardiac arrhythmia, unspecified: I49.9

## 2015-11-24 HISTORY — DX: Other complications of anesthesia, initial encounter: T88.59XA

## 2015-11-24 HISTORY — DX: Other specified postprocedural states: Z98.890

## 2015-11-24 HISTORY — PX: INSERTION OF MESH: SHX5868

## 2015-11-24 HISTORY — PX: INGUINAL HERNIA REPAIR: SHX194

## 2015-11-24 HISTORY — DX: Nausea with vomiting, unspecified: R11.2

## 2015-11-24 HISTORY — DX: Adverse effect of unspecified anesthetic, initial encounter: T41.45XA

## 2015-11-24 SURGERY — REPAIR, HERNIA, INGUINAL, ADULT
Anesthesia: Regional | Site: Groin | Laterality: Right

## 2015-11-24 MED ORDER — DEXAMETHASONE SODIUM PHOSPHATE 4 MG/ML IJ SOLN
INTRAMUSCULAR | Status: DC | PRN
  Administered 2015-11-24: 10 mg via INTRAVENOUS

## 2015-11-24 MED ORDER — FENTANYL CITRATE (PF) 100 MCG/2ML IJ SOLN
INTRAMUSCULAR | Status: AC
  Filled 2015-11-24: qty 2

## 2015-11-24 MED ORDER — LACTATED RINGERS IV SOLN
INTRAVENOUS | Status: DC | PRN
Start: 2015-11-24 — End: 2015-11-24
  Administered 2015-11-24 (×3): via INTRAVENOUS

## 2015-11-24 MED ORDER — BUPIVACAINE-EPINEPHRINE 0.5% -1:200000 IJ SOLN
INTRAMUSCULAR | Status: DC | PRN
  Administered 2015-11-24: 9.5 mL

## 2015-11-24 MED ORDER — FENTANYL CITRATE (PF) 100 MCG/2ML IJ SOLN
INTRAMUSCULAR | Status: DC | PRN
  Administered 2015-11-24: 100 ug via INTRAVENOUS

## 2015-11-24 MED ORDER — CEFAZOLIN SODIUM-DEXTROSE 2-4 GM/100ML-% IV SOLN
INTRAVENOUS | Status: AC
  Filled 2015-11-24: qty 100

## 2015-11-24 MED ORDER — GLYCOPYRROLATE 0.2 MG/ML IV SOSY
PREFILLED_SYRINGE | INTRAVENOUS | Status: AC
  Filled 2015-11-24: qty 3

## 2015-11-24 MED ORDER — CHLORHEXIDINE GLUCONATE CLOTH 2 % EX PADS: 6.0000 | MEDICATED_PAD | Freq: Once | CUTANEOUS | Status: DC

## 2015-11-24 MED ORDER — PROMETHAZINE HCL 25 MG/ML IJ SOLN
INTRAMUSCULAR | Status: AC
  Filled 2015-11-24: qty 1

## 2015-11-24 MED ORDER — PHENYLEPHRINE 40 MCG/ML (10ML) SYRINGE FOR IV PUSH (FOR BLOOD PRESSURE SUPPORT)
PREFILLED_SYRINGE | INTRAVENOUS | Status: AC
  Filled 2015-11-24: qty 10

## 2015-11-24 MED ORDER — GLYCOPYRROLATE 0.2 MG/ML IJ SOLN
0.2000 mg | Freq: Once | INTRAMUSCULAR | Status: AC | PRN
  Administered 2015-11-24 (×3): 0.2 mg via INTRAVENOUS

## 2015-11-24 MED ORDER — ONDANSETRON HCL 4 MG/2ML IJ SOLN
INTRAMUSCULAR | Status: AC
  Filled 2015-11-24: qty 2

## 2015-11-24 MED ORDER — SCOPOLAMINE 1 MG/3DAYS TD PT72: 1.0000 | MEDICATED_PATCH | Freq: Once | TRANSDERMAL | Status: DC | PRN

## 2015-11-24 MED ORDER — FENTANYL CITRATE (PF) 100 MCG/2ML IJ SOLN
25.0000 ug | INTRAMUSCULAR | Status: DC | PRN
  Administered 2015-11-24 (×2): 50 ug via INTRAVENOUS

## 2015-11-24 MED ORDER — SCOPOLAMINE 1 MG/3DAYS TD PT72
MEDICATED_PATCH | TRANSDERMAL | Status: AC
  Filled 2015-11-24: qty 1

## 2015-11-24 MED ORDER — LIDOCAINE-EPINEPHRINE (PF) 1 %-1:200000 IJ SOLN
INTRAMUSCULAR | Status: AC
  Filled 2015-11-24: qty 30

## 2015-11-24 MED ORDER — PHENYLEPHRINE HCL 10 MG/ML IJ SOLN
INTRAMUSCULAR | Status: DC | PRN
  Administered 2015-11-24 (×3): 80 ug via INTRAVENOUS

## 2015-11-24 MED ORDER — ONDANSETRON HCL 4 MG/2ML IJ SOLN
INTRAMUSCULAR | Status: DC | PRN
  Administered 2015-11-24: 4 mg via INTRAVENOUS

## 2015-11-24 MED ORDER — LIDOCAINE 2% (20 MG/ML) 5 ML SYRINGE
INTRAMUSCULAR | Status: AC
  Filled 2015-11-24: qty 5

## 2015-11-24 MED ORDER — LACTATED RINGERS IV SOLN
INTRAVENOUS | Status: DC
  Administered 2015-11-24: 10 mL/h via INTRAVENOUS
  Administered 2015-11-24: 10:00:00 via INTRAVENOUS

## 2015-11-24 MED ORDER — SUCCINYLCHOLINE CHLORIDE 20 MG/ML IJ SOLN
INTRAMUSCULAR | Status: DC | PRN
  Administered 2015-11-24: 50 mg via INTRAVENOUS

## 2015-11-24 MED ORDER — FENTANYL CITRATE (PF) 100 MCG/2ML IJ SOLN
50.0000 ug | INTRAMUSCULAR | Status: DC | PRN
  Administered 2015-11-24: 100 ug via INTRAVENOUS

## 2015-11-24 MED ORDER — BUPIVACAINE-EPINEPHRINE (PF) 0.5% -1:200000 IJ SOLN
INTRAMUSCULAR | Status: AC
  Filled 2015-11-24: qty 30

## 2015-11-24 MED ORDER — PROPOFOL 10 MG/ML IV BOLUS
INTRAVENOUS | Status: DC | PRN
  Administered 2015-11-24: 200 mg via INTRAVENOUS

## 2015-11-24 MED ORDER — MIDAZOLAM HCL 2 MG/2ML IJ SOLN
INTRAMUSCULAR | Status: AC
  Filled 2015-11-24: qty 2

## 2015-11-24 MED ORDER — ROCURONIUM BROMIDE 100 MG/10ML IV SOLN
INTRAVENOUS | Status: DC | PRN
  Administered 2015-11-24: 30 mg via INTRAVENOUS
  Administered 2015-11-24: 10 mg via INTRAVENOUS

## 2015-11-24 MED ORDER — ROCURONIUM BROMIDE 10 MG/ML (PF) SYRINGE
PREFILLED_SYRINGE | INTRAVENOUS | Status: AC
  Filled 2015-11-24: qty 10

## 2015-11-24 MED ORDER — CEFAZOLIN SODIUM-DEXTROSE 2-4 GM/100ML-% IV SOLN
2.0000 g | INTRAVENOUS | Status: AC
  Administered 2015-11-24: 2 g via INTRAVENOUS

## 2015-11-24 MED ORDER — SCOPOLAMINE 1 MG/3DAYS TD PT72
MEDICATED_PATCH | TRANSDERMAL | Status: DC | PRN
  Administered 2015-11-24: 1 via TRANSDERMAL

## 2015-11-24 MED ORDER — SUGAMMADEX SODIUM 200 MG/2ML IV SOLN
INTRAVENOUS | Status: DC | PRN
  Administered 2015-11-24: 189.6 mg via INTRAVENOUS

## 2015-11-24 MED ORDER — LIDOCAINE HCL (CARDIAC) 20 MG/ML IV SOLN
INTRAVENOUS | Status: DC | PRN
  Administered 2015-11-24: 10 mg via INTRAVENOUS

## 2015-11-24 MED ORDER — SUGAMMADEX SODIUM 200 MG/2ML IV SOLN
INTRAVENOUS | Status: AC
  Filled 2015-11-24: qty 2

## 2015-11-24 MED ORDER — PROPOFOL 500 MG/50ML IV EMUL
INTRAVENOUS | Status: AC
  Filled 2015-11-24: qty 50

## 2015-11-24 MED ORDER — KETOROLAC TROMETHAMINE 30 MG/ML IJ SOLN
INTRAMUSCULAR | Status: AC
  Filled 2015-11-24: qty 1

## 2015-11-24 MED ORDER — PROMETHAZINE HCL 25 MG/ML IJ SOLN: 6.2500 mg | Freq: Once | INTRAMUSCULAR | Status: DC

## 2015-11-24 MED ORDER — DEXAMETHASONE SODIUM PHOSPHATE 10 MG/ML IJ SOLN
INTRAMUSCULAR | Status: AC
  Filled 2015-11-24: qty 1

## 2015-11-24 MED ORDER — SODIUM BICARBONATE 4 % IV SOLN
INTRAVENOUS | Status: AC
  Filled 2015-11-24: qty 5

## 2015-11-24 MED ORDER — MIDAZOLAM HCL 5 MG/5ML IJ SOLN
INTRAMUSCULAR | Status: DC | PRN
  Administered 2015-11-24: 2 mg via INTRAVENOUS

## 2015-11-24 MED ORDER — MIDAZOLAM HCL 2 MG/2ML IJ SOLN
1.0000 mg | INTRAMUSCULAR | Status: DC | PRN
  Administered 2015-11-24: 2 mg via INTRAVENOUS

## 2015-11-24 MED ORDER — HYDROCODONE-ACETAMINOPHEN 5-325 MG PO TABS: 1.0000 | ORAL_TABLET | ORAL | 0 refills | Status: AC | PRN

## 2015-11-24 MED ORDER — ARTIFICIAL TEARS OP OINT
TOPICAL_OINTMENT | OPHTHALMIC | Status: AC
  Filled 2015-11-24: qty 3.5

## 2015-11-24 MED ORDER — BUPIVACAINE-EPINEPHRINE (PF) 0.5% -1:200000 IJ SOLN
INTRAMUSCULAR | Status: DC | PRN
  Administered 2015-11-24: 30 mL

## 2015-11-24 MED ORDER — BUPIVACAINE LIPOSOME 1.3 % IJ SUSP
INTRAMUSCULAR | Status: AC
  Filled 2015-11-24: qty 20

## 2015-11-24 SURGICAL SUPPLY — 62 items
BENZOIN TINCTURE PRP APPL 2/3 (GAUZE/BANDAGES/DRESSINGS) IMPLANT
BLADE CLIPPER SURG (BLADE) IMPLANT
BLADE HEX COATED 2.75 (ELECTRODE) ×3 IMPLANT
BLADE SURG 10 STRL SS (BLADE) ×3 IMPLANT
CANISTER SUCT 1200ML W/VALVE (MISCELLANEOUS) ×3 IMPLANT
CHLORAPREP W/TINT 26ML (MISCELLANEOUS) ×3 IMPLANT
CLOSURE WOUND 1/2 X4 (GAUZE/BANDAGES/DRESSINGS)
COVER BACK TABLE 60X90IN (DRAPES) ×6 IMPLANT
COVER MAYO STAND STRL (DRAPES) ×3 IMPLANT
DECANTER SPIKE VIAL GLASS SM (MISCELLANEOUS) IMPLANT
DERMABOND ADVANCED (GAUZE/BANDAGES/DRESSINGS)
DERMABOND ADVANCED .7 DNX12 (GAUZE/BANDAGES/DRESSINGS) IMPLANT
DRAIN PENROSE 1/2X12 LTX STRL (WOUND CARE) ×3 IMPLANT
DRAPE LAPAROTOMY 100X72 PEDS (DRAPES) ×3 IMPLANT
DRAPE LAPAROTOMY TRNSV 102X78 (DRAPE) IMPLANT
DRAPE UTILITY XL STRL (DRAPES) ×3 IMPLANT
ELECT REM PT RETURN 9FT ADLT (ELECTROSURGICAL) ×3
ELECTRODE REM PT RTRN 9FT ADLT (ELECTROSURGICAL) ×1 IMPLANT
GLOVE BIO SURGEON STRL SZ 6.5 (GLOVE) ×4 IMPLANT
GLOVE BIO SURGEONS STRL SZ 6.5 (GLOVE) ×2
GLOVE BIOGEL PI IND STRL 6.5 (GLOVE) ×2 IMPLANT
GLOVE BIOGEL PI INDICATOR 6.5 (GLOVE) ×4
GLOVE EUDERMIC 7 POWDERFREE (GLOVE) ×3 IMPLANT
GOWN STRL REUS W/ TWL LRG LVL3 (GOWN DISPOSABLE) ×1 IMPLANT
GOWN STRL REUS W/ TWL XL LVL3 (GOWN DISPOSABLE) ×1 IMPLANT
GOWN STRL REUS W/TWL LRG LVL3 (GOWN DISPOSABLE) ×2
GOWN STRL REUS W/TWL XL LVL3 (GOWN DISPOSABLE) ×2
MESH ULTRAPRO 3X6 7.6X15CM (Mesh General) ×3 IMPLANT
NEEDLE HYPO 22GX1.5 SAFETY (NEEDLE) ×3 IMPLANT
NEEDLE HYPO 25X1 1.5 SAFETY (NEEDLE) ×3 IMPLANT
NS IRRIG 1000ML POUR BTL (IV SOLUTION) ×3 IMPLANT
PACK BASIN DAY SURGERY FS (CUSTOM PROCEDURE TRAY) ×3 IMPLANT
PENCIL BUTTON HOLSTER BLD 10FT (ELECTRODE) ×3 IMPLANT
SLEEVE SCD COMPRESS KNEE MED (MISCELLANEOUS) IMPLANT
SPONGE GAUZE 4X4 12PLY STER LF (GAUZE/BANDAGES/DRESSINGS) IMPLANT
SPONGE LAP 4X18 X RAY DECT (DISPOSABLE) IMPLANT
STAPLER VISISTAT 35W (STAPLE) IMPLANT
STRIP CLOSURE SKIN 1/2X4 (GAUZE/BANDAGES/DRESSINGS) IMPLANT
SUT MNCRL AB 4-0 PS2 18 (SUTURE) ×3 IMPLANT
SUT PROLENE 1 CT (SUTURE) IMPLANT
SUT PROLENE 2 0 CT2 30 (SUTURE) ×9 IMPLANT
SUT SILK 2 0 SH (SUTURE) IMPLANT
SUT SILK 2 0 TIES 17X18 (SUTURE) ×2
SUT SILK 2-0 18XBRD TIE BLK (SUTURE) ×1 IMPLANT
SUT SILK 3 0 SH 30 (SUTURE) IMPLANT
SUT VIC AB 2-0 CT1 27 (SUTURE)
SUT VIC AB 2-0 CT1 TAPERPNT 27 (SUTURE) IMPLANT
SUT VIC AB 2-0 SH 27 (SUTURE) ×2
SUT VIC AB 2-0 SH 27XBRD (SUTURE) ×1 IMPLANT
SUT VIC AB 3-0 54X BRD REEL (SUTURE) IMPLANT
SUT VIC AB 3-0 BRD 54 (SUTURE)
SUT VIC AB 3-0 FS2 27 (SUTURE) IMPLANT
SUT VIC AB 3-0 SH 27 (SUTURE) ×2
SUT VIC AB 3-0 SH 27X BRD (SUTURE) ×1 IMPLANT
SUT VICRYL 3-0 CR8 SH (SUTURE) IMPLANT
SYR 20CC LL (SYRINGE) ×3 IMPLANT
SYRINGE 10CC LL (SYRINGE) ×3 IMPLANT
TOWEL OR NON WOVEN STRL DISP B (DISPOSABLE) ×3 IMPLANT
TRAY DSU PREP LF (CUSTOM PROCEDURE TRAY) ×3 IMPLANT
TUBE CONNECTING 20'X1/4 (TUBING) ×1
TUBE CONNECTING 20X1/4 (TUBING) ×2 IMPLANT
YANKAUER SUCT BULB TIP NO VENT (SUCTIONS) ×3 IMPLANT

## 2015-11-24 NOTE — Transfer of Care (Signed)
Immediate Anesthesia Transfer of Care Note  Patient: Kurt Young, DDS  Procedure(s) Performed: Procedure(s): OPEN RIGHT INGUINAL HERNIA REPAIR WITH MESH (Right) INSERTION OF MESH (Right)  Patient Location: PACU  Anesthesia Type:GA combined with regional for post-op pain  Level of Consciousness: sedated  Airway & Oxygen Therapy: Patient Spontanous Breathing and Patient connected to face mask oxygen  Post-op Assessment: Report given to RN and Post -op Vital signs reviewed and stable  Post vital signs: Reviewed and stable  Last Vitals:  Vitals:   11/24/15 0805 11/24/15 0810  BP:  126/79  Pulse: (!) 49 (!) 44  Resp: 10 (!) 9  Temp:      Last Pain: There were no vitals filed for this visit.       Complications: No apparent anesthesia complications

## 2015-11-24 NOTE — Op Note (Signed)
Patient Name:           Kurt Young, DDS   Date of Surgery:        11/24/2015  Pre op Diagnosis:      Right inguinal hernia  Post op Diagnosis:    Indirect right inguinal hernia, sliding type  Procedure:                 Open repair right inguinal hernia with mesh Karl Pock repair)  Surgeon:                     Edsel Petrin. Dalbert Batman, M.D., FACS  Assistant:                      OR staff   Indication for Assistant: N/A  Operative Indications:   This is a very pleasant 70 year old gentleman. 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 He is referred by Dr. Ricardo Jericho for evaluation and surgical management of a symptomatic right inguinal hernia.      He underwent open repair of left inguinal hernia with mesh by Dr. Deon Pilling at least 15 years ago. He has done well with that. He has a two-month history of a bulge in his right groin and it has now become painful. 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 No history of incarceration. He's lost 30 pounds intentionally and noticed a hernia after he lost the weight. He is motivated to have this repaired electively      Comorbidities include open appendectomy when he was younger. Fairly significant incision. History of atrial fibrillation requiring cardioversion in May 2017.  He takes lots of vitamins and supplements including omega-3 fatty acid and I told him to stop all of this for about a week before the surgery. He takes Flomax. He takes low-dose Lopressor. He is pretty healthy and physically active. He's been followed by Dr. Gaynelle Arabian for urethral stricture Had some bleeding in his bladder following a minor procedure.    I discussed the indications, details, techniques, and numerous risk of open right inguinal hernia repair with mesh. He is aware the risk of bleeding, infection, recurrence, nerve damage with  chronic pain or numbness, injury to the testicle or bladder or intestine with major reconstructive surgery, although rare. He understands all of these issues. We talked about return to work and exercise. He understands all these issues. All his questions were answered. He agrees with this plan. We will schedule at his convenience.  Operative Findings:       I found that he had an indirect writing oral hernia.  This was a sliding type with some appendices epiploica making a part of the wall of the sac.  Once I opened the sac I simply created a pursestring suture of Vicryl, tied that down and resected the redundant sac and reduced this.  The inguinal ligament and external Blight medially were not very well develop so I incorporated all of the inguinal ligament and external Blight in the suture fixation of the mesh.  There was no evidence of femoral hernia.  Procedure in Detail:          A Tapp block was performed in the holding area by the anesthesia department.  Following the induction of general LMA anesthesia the patient's abdomen and groin and genitalia  were prepped and draped in a sterile fashion.  Intravenous antibiotics were given.  Surgical timeout was performed.  0.5% Marcaine with epinephrine was used as local infiltration anesthetic into the superficial tissues.      Transverse incision was made in the right groin overlying the inguinal canal.  Dissection was carried down through the subcutaneous tissue exposing the aponeurosis of the external oblique.  The external inguinal ring was identified.  The external oblique was incised in the direction of its fibers.  This was dissected away from the underlying tissues and self-retaining retractors were placed.  One small sensory nerve that was associated with the cord structures was traced back to its emergence of the muscles laterally, clamped, divided and ligated with a 2-0 silk tie.  The redundant nerve medially was excised.  The cord structures  were mobilized and encircled with a Penrose drain.  Cremasteric muscle fibers were completely skeletonized.  I found the indirect sac.  I dissected the cord structures away from this all the way back to the level of the internal ring.  I opened the indirect sac and found a sliding hernia.  After defining this carefully I closed the indirect sac with the Purstring suture of 2-0 Vicryl and then reduce this.  I tightened up the internal inguinal ring laterally with 2 figure-of-eight sutures of 2-0 Vicryl.  The hernia repair was performed using a 3" x 6" piece of ultra Pro mesh.  The mesh was cut at the corners to accommodate the anatomy of the wound and sutured in place with running sutures and interrupted sutures of 2-0 Prolene.  The mesh was sutured so as to generously overlap the fascia at the pubic tubercle, then along the inguinal ligament inferiorly.  Medially, superiorly, and superiolaterally interrupted mattress sutures of 2-0 Prolene were placed.  The mesh was incised laterally so as to wraparound the cord structures of the internal ring.  The tails of the mesh were overlapped laterally.  Further Prolene sutures were placed laterally.  This provided very secure repair both medial and lateral to the internal ring but allowed an adequate fingertip opening for the cord structures.  There was irrigated with saline.  The repair was inspected and seemed to be quite good.  The external oblique was closed with a running suture of 2-0 Vicryl.  Scarpa's fascia was closed with 3-0 Vicryl and the skin closed with subcuticular 4-0 Monocryl and Dermabond.  The patient tolerated the procedure well was taken to PACU in stable condition.  EBL 10 mL.  Counts correct.  Complications none.     Edsel Petrin. Dalbert Batman, M.D., FACS General and Minimally Invasive Surgery Breast and Colorectal Surgery  11/24/2015 9:36 AM

## 2015-11-24 NOTE — Anesthesia Preprocedure Evaluation (Addendum)
Anesthesia Evaluation  Patient identified by MRN, date of birth, ID band Patient awake    Reviewed: Allergy & Precautions, H&P , NPO status , Patient's Chart, lab work & pertinent test results, reviewed documented beta blocker date and time   History of Anesthesia Complications (+) PONV  Airway Mallampati: II  TM Distance: >3 FB Neck ROM: Full    Dental no notable dental hx. (+) Teeth Intact, Dental Advisory Given   Pulmonary neg pulmonary ROS,    Pulmonary exam normal breath sounds clear to auscultation       Cardiovascular negative cardio ROS  + dysrhythmias Atrial Fibrillation  Rhythm:Regular Rate:Normal     Neuro/Psych negative neurological ROS  negative psych ROS   GI/Hepatic negative GI ROS, Neg liver ROS,   Endo/Other  negative endocrine ROS  Renal/GU negative Renal ROS  negative genitourinary   Musculoskeletal  (+) Arthritis , Osteoarthritis,    Abdominal   Peds  Hematology negative hematology ROS (+)   Anesthesia Other Findings   Reproductive/Obstetrics negative OB ROS                            Anesthesia Physical Anesthesia Plan  ASA: II  Anesthesia Plan: General and Regional   Post-op Pain Management: GA combined w/ Regional for post-op pain   Induction: Intravenous  Airway Management Planned: Oral ETT  Additional Equipment:   Intra-op Plan:   Post-operative Plan: Extubation in OR  Informed Consent: I have reviewed the patients History and Physical, chart, labs and discussed the procedure including the risks, benefits and alternatives for the proposed anesthesia with the patient or authorized representative who has indicated his/her understanding and acceptance.   Dental advisory given  Plan Discussed with: CRNA  Anesthesia Plan Comments:         Anesthesia Quick Evaluation

## 2015-11-24 NOTE — Anesthesia Postprocedure Evaluation (Signed)
Anesthesia Post Note  Patient: Myra Gianotti, DDS  Procedure(s) Performed: Procedure(s) (LRB): OPEN RIGHT INGUINAL HERNIA REPAIR WITH MESH (Right) INSERTION OF MESH (Right)  Patient location during evaluation: PACU Anesthesia Type: General and Regional Level of consciousness: awake and alert Pain management: pain level controlled Vital Signs Assessment: post-procedure vital signs reviewed and stable Respiratory status: spontaneous breathing, nonlabored ventilation and respiratory function stable Cardiovascular status: blood pressure returned to baseline and stable Postop Assessment: no signs of nausea or vomiting Anesthetic complications: no    Last Vitals:  Vitals:   11/24/15 1045 11/24/15 1100  BP: 127/73 123/73  Pulse: (!) 51 (!) 52  Resp: (!) 21 15  Temp:  36.3 C    Last Pain:  Vitals:   11/24/15 1045  PainSc: 3                  Muhsin Doris,W. EDMOND

## 2015-11-24 NOTE — Interval H&P Note (Signed)
History and Physical Interval Note:  11/24/2015 7:40 AM  Myra Gianotti, DDS  has presented today for surgery, with the diagnosis of Right inguinal hernia  The various methods of treatment have been discussed with the patient and family. After consideration of risks, benefits and other options for treatment, the patient has consented to  Procedure(s): OPEN RIGHT INGUINAL HERNIA REPAIR WITH MESH (Right) INSERTION OF MESH (Right) as a surgical intervention .  The patient's history has been reviewed, patient examined, no change in status, stable for surgery.  I have reviewed the patient's chart and labs.  Questions were answered to the patient's satisfaction.     Adin Hector

## 2015-11-24 NOTE — Anesthesia Procedure Notes (Signed)
Procedure Name: Intubation Date/Time: 11/24/2015 8:32 AM Performed by: Marrianne Mood Pre-anesthesia Checklist: Patient identified, Emergency Drugs available, Suction available, Patient being monitored and Timeout performed Patient Re-evaluated:Patient Re-evaluated prior to inductionOxygen Delivery Method: Circle system utilized Preoxygenation: Pre-oxygenation with 100% oxygen Intubation Type: IV induction Ventilation: Mask ventilation without difficulty Laryngoscope Size: Miller and 3 Grade View: Grade III Tube type: Oral Tube size: 8.0 mm Number of attempts: 1 Airway Equipment and Method: Stylet and Oral airway Placement Confirmation: ETT inserted through vocal cords under direct vision,  positive ETCO2 and breath sounds checked- equal and bilateral Secured at: 23 cm Tube secured with: Tape Dental Injury: Teeth and Oropharynx as per pre-operative assessment

## 2015-11-24 NOTE — Progress Notes (Signed)
Assisted Dr. Edmond Fitzgerald with right, ultrasound guided, transabdominal plane block. Side rails up, monitors on throughout procedure. See vital signs in flow sheet. Tolerated Procedure well. 

## 2015-11-24 NOTE — Anesthesia Procedure Notes (Addendum)
Anesthesia Regional Block:  TAP block  Pre-Anesthetic Checklist: ,, timeout performed, Correct Patient, Correct Site, Correct Laterality, Correct Procedure, Correct Position, site marked, Risks and benefits discussed, pre-op evaluation,  At surgeon's request and post-op pain management  Laterality: Right  Prep: Maximum Sterile Barrier Precautions used, chloraprep       Needles:  Injection technique: Single-shot  Needle Type: Echogenic Stimulator Needle     Needle Length: 9cm 9 cm Needle Gauge: 21 and 21 G    Additional Needles:  Procedures: ultrasound guided (picture in chart) TAP block Narrative:  Start time: 11/24/2015 8:00 AM End time: 11/24/2015 8:10 AM Injection made incrementally with aspirations every 5 mL. Anesthesiologist: Roderic Palau  Additional Notes: 2% Lidocaine skin wheel.

## 2015-11-24 NOTE — Discharge Instructions (Signed)
[facility] _______[facility] ° °UMBILICAL OR INGUINAL HERNIA REPAIR: POST OP INSTRUCTIONS ° °Always review your discharge instruction sheet given to you by the facility where your surgery was performed. °IF YOU HAVE DISABILITY OR FAMILY LEAVE FORMS, YOU MUST BRING THEM TO THE OFFICE FOR PROCESSING.   °DO NOT GIVE THEM TO YOUR DOCTOR. ° °1. A  prescription for pain medication may be given to you upon discharge.  Take your pain medication as prescribed, if needed.  If narcotic pain medicine is not needed, then you may take acetaminophen (Tylenol) or ibuprofen (Advil) as needed. °2. Take your usually prescribed medications unless otherwise directed. °3. If you need a refill on your pain medication, please contact your pharmacy.  They will contact our office to request authorization. Prescriptions will not be filled after 5 pm or on week-ends. °4. You should follow a light diet the first 24 hours after arrival home, such as soup and crackers, etc.  Be sure to include lots of fluids daily.  Resume your normal diet the day after surgery. °5. Most patients will experience some swelling and bruising around the umbilicus or in the groin and scrotum.  Ice packs and reclining will help.  Swelling and bruising can take several days to resolve.  °6. It is common to experience some constipation if taking pain medication after surgery.  Increasing fluid intake and taking a stool softener (such as Colace) will usually help or prevent this problem from occurring.  A mild laxative (Milk of Magnesia or Miralax) should be taken according to package directions if there are no bowel movements after 48 hours. °7. Unless discharge instructions indicate otherwise, you may remove your bandages 24-48 hours after surgery, and you may shower at that time.  You may have steri-strips (small skin tapes) in place directly over the incision.  These strips should be left on the skin for 7-10 days.  If your surgeon used skin glue on the  incision, you may shower in 24 hours.  The glue will flake off over the next 2-3 weeks.  Any sutures or staples will be removed at the office during your follow-up visit. °8. ACTIVITIES:  You may resume regular (light) daily activities beginning the next day--such as daily self-care, walking, climbing stairs--gradually increasing activities as tolerated.  You may have sexual intercourse when it is comfortable.  Refrain from any heavy lifting or straining until approved by your doctor. °a. You may drive when you are no longer taking prescription pain medication, you can comfortably wear a seatbelt, and you can safely maneuver your car and apply brakes. °b. RETURN TO WORK:  __________________________________________________________ °9. You should see your doctor in the office for a follow-up appointment approximately 2-3 weeks after your surgery.  Make sure that you call for this appointment within a day or two after you arrive home to insure a convenient appointment time. °10. OTHER INSTRUCTIONS:  __________________________________________________________________________________________________________________________________________________________________________________________  °WHEN TO CALL YOUR DOCTOR: °1. Fever over 101.0 °2. Inability to urinate °3. Nausea and/or vomiting °4. Extreme swelling or bruising °5. Continued bleeding from incision. °6. Increased pain, redness, or drainage from the incision ° °The clinic staff is available to answer your questions during regular business hours.  Please don’t hesitate to call and ask to speak to one of the nurses for clinical concerns.  If you have a medical emergency, go to the nearest emergency room or call 911.  A surgeon from [facility] is always on call at the hospital ° ° °  [address], [address], [place], [place]  [address] ? ° [address] [address], [place], [place]   [address] °[phone] ? [phone] ? FAX [phone] °Web site:  [email or web address] ° °Post Anesthesia Home Care Instructions ° °Activity: °Get plenty of rest for the remainder of the day. A responsible adult should stay with you for 24 hours following the procedure.  °For the next 24 hours, DO NOT: °-Drive a car °-Operate machinery °-Drink alcoholic beverages °-Take any medication unless instructed by your physician °-Make any legal decisions or sign important papers. ° °Meals: °Start with liquid foods such as gelatin or soup. Progress to regular foods as tolerated. Avoid greasy, spicy, heavy foods. If nausea and/or vomiting occur, drink only clear liquids until the nausea and/or vomiting subsides. Call your physician if vomiting continues. ° °Special Instructions/Symptoms: °Your throat may feel dry or sore from the anesthesia or the breathing tube placed in your throat during surgery. If this causes discomfort, gargle with warm salt water. The discomfort should disappear within 24 hours. ° °If you had a scopolamine patch placed behind your ear for the management of post- operative nausea and/or vomiting: ° °1. The medication in the patch is effective for 72 hours, after which it should be removed.  Wrap patch in a tissue and discard in the trash. Wash hands thoroughly with soap and water. °2. You may remove the patch earlier than 72 hours if you experience unpleasant side effects which may include dry mouth, dizziness or visual disturbances. °3. Avoid touching the patch. Wash your hands with soap and water after contact with the patch. °  ° °

## 2015-11-26 ENCOUNTER — Encounter (HOSPITAL_BASED_OUTPATIENT_CLINIC_OR_DEPARTMENT_OTHER): Payer: Self-pay | Admitting: General Surgery

## 2015-12-08 DIAGNOSIS — Z23 Encounter for immunization: Secondary | ICD-10-CM | POA: Diagnosis not present

## 2015-12-29 DIAGNOSIS — M25512 Pain in left shoulder: Secondary | ICD-10-CM

## 2015-12-29 DIAGNOSIS — M47812 Spondylosis without myelopathy or radiculopathy, cervical region: Secondary | ICD-10-CM

## 2015-12-29 DIAGNOSIS — M25511 Pain in right shoulder: Secondary | ICD-10-CM

## 2015-12-29 HISTORY — DX: Pain in right shoulder: M25.511

## 2015-12-29 HISTORY — DX: Pain in left shoulder: M25.512

## 2015-12-29 HISTORY — DX: Spondylosis without myelopathy or radiculopathy, cervical region: M47.812

## 2016-01-17 DIAGNOSIS — M5412 Radiculopathy, cervical region: Secondary | ICD-10-CM | POA: Diagnosis not present

## 2016-01-17 DIAGNOSIS — S46011A Strain of muscle(s) and tendon(s) of the rotator cuff of right shoulder, initial encounter: Secondary | ICD-10-CM | POA: Diagnosis not present

## 2016-01-17 DIAGNOSIS — S46012A Strain of muscle(s) and tendon(s) of the rotator cuff of left shoulder, initial encounter: Secondary | ICD-10-CM | POA: Diagnosis not present

## 2016-01-19 ENCOUNTER — Encounter: Payer: Self-pay | Admitting: Family Medicine

## 2016-01-22 DIAGNOSIS — M25511 Pain in right shoulder: Secondary | ICD-10-CM | POA: Diagnosis not present

## 2016-01-22 DIAGNOSIS — M542 Cervicalgia: Secondary | ICD-10-CM | POA: Diagnosis not present

## 2016-01-31 DIAGNOSIS — M25511 Pain in right shoulder: Secondary | ICD-10-CM | POA: Diagnosis not present

## 2016-02-02 ENCOUNTER — Encounter: Payer: Self-pay | Admitting: Family Medicine

## 2016-02-04 ENCOUNTER — Encounter: Payer: Self-pay | Admitting: Cardiology

## 2016-02-04 ENCOUNTER — Ambulatory Visit (INDEPENDENT_AMBULATORY_CARE_PROVIDER_SITE_OTHER): Payer: Medicare Other | Admitting: Cardiology

## 2016-02-04 VITALS — BP 116/70 | HR 60 | Ht 79.0 in | Wt 211.8 lb

## 2016-02-04 DIAGNOSIS — I4892 Unspecified atrial flutter: Secondary | ICD-10-CM

## 2016-02-04 NOTE — Patient Instructions (Signed)
Medication Instructions:  Please decrease your Metoprolol to 12.5 mg twice a day for 3 days then stop. Continue all other medications as listed.  Follow-Up: Follow up in 1 year with Dr. Marlou Porch.  You will receive a letter in the mail 2 months before you are due.  Please call us when you receive this letter to schedule your follow up appointment.  If you need a refill on your cardiac medications before your next appointment, please call your pharmacy.  Thank you for choosing [facility]!!

## 2016-02-04 NOTE — Progress Notes (Signed)
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  Underwent successful TEE cardioversion on 07/05/15. EF was normal.   Previous symptoms and felt short of breath with exertion, cutting grass, heart rate 132 bpm. Heart rate in clinic on 06/30/15 is 132 bpm with typical appearing atrial flutter pattern, negative deflection in lead 23 aVF and positive in V1. Variable conduction.  On 06/11/15 - mowing with push mower, light headed, dizzy. Heart was racing, pounding. Over the past year he has noted frequent bouts of palpitations that originally started for a few seconds duration than lasting minutes then hours then days.  Normal rate 60's. 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   Father was recommended pacer, cardac arrest in snow.   Non smoker. Bronchitis 2 weeks before this recent episode.  Mild coffee. No significant alcohol use. No stimulants, no Sudafed.  BPH -prior bladder bleeding episode. Dr. Gaynelle Arabian. No HTN.   When lay on left side heart palpsIn the past. No orthopnea. No diarrhea. No DM.   He has been doing quite well with exercise, he is lost weight, decreased body fat. He is quite vigorous about this, his 51 year old son and he work out together. 1-1/2 hours.  He has not felt any further episodes. He would like to try to come up of the metoprolol low-dose. This seems reasonable.  Had a hernia repair on 11/24/15. Fully recovered.     Past Medical History:  Diagnosis Date  . Atrial flutter with rapid ventricular response ([place])   . Cervical osteoarthritis 12/2015   Dr. Noemi Chapel got an MRI C spine 12/2015: signif C3-4, C4-5, and C5-6 bulging discs with foraminal stenosis bilat at C5-6--improved on prednisone.  . Complication  of anesthesia   . DJD (degenerative joint disease)   . Dysrhythmia    a-flutter with RVR  . PONV (postoperative nausea and vomiting)   . Right inguinal hernia 08/2015   Plan for surgical repair as of 10/12/15 eval by Dr. Dalbert Batman.  . Shoulder pain, bilateral 12/2015   possible acute traumatic poss RC tear: Dr. Noemi Chapel to do R shoulder MRI as of 01/17/16.  . Skin cancer    melanoma X 2; squamous cell X 1  . Urethral stricture 2010   Dr Hartley Barefoot    Past Surgical History:  Procedure Laterality Date  . APPENDECTOMY    . CARDIOVERSION N/A 07/05/2015   Procedure: CARDIOVERSION;  Surgeon: Fay Records, MD;  [facility] ENDOSCOPY;  Service: Cardiovascular;  Laterality: N/A;  . COLONOSCOPY  2002   negative  . INGUINAL HERNIA REPAIR Left   . INGUINAL HERNIA REPAIR Right 11/24/2015   Procedure: OPEN RIGHT INGUINAL HERNIA REPAIR WITH MESH;  Surgeon: Fanny Skates, MD;  Location: [facility];  Service: General;  Laterality: Right;  . INSERTION OF MESH Right 11/24/2015   Procedure: INSERTION OF MESH;  Surgeon: Fanny Skates, MD;  Location: [facility];  Service: General;  Laterality: Right;  . JOINT REPLACEMENT Left   . MOHS SURGERY     X 2 of back  . TEE WITHOUT CARDIOVERSION N/A 07/05/2015   PFO noted.  Procedure: TRANSESOPHAGEAL ECHOCARDIOGRAM (TEE);  Surgeon: Fay Records, MD;  Location: [facility]  ENDOSCOPY;  Service: Cardiovascular;  Laterality: N/A;  . tonsillectomy    . TOTAL KNEE ARTHROPLASTY  2010   Dr Noemi Chapel    Current Medications: Outpatient Medications Prior to Visit  Medication Sig Dispense Refill  . Ascorbic Acid (VITAMIN C) 1000 MG tablet Take 1,000 mg by mouth 2 (two) times daily.    . cholecalciferol (VITAMIN D) 1000 units tablet Take 1,000 Units by mouth daily.    . Cyanocobalamin (VITAMIN B-12 PO) Take 1 tablet by mouth daily.    [name] HYDROcodone-acetaminophen (NORCO/VICODIN) 5-325 MG tablet Take 1-2 tablets by mouth every 4 (four) hours as needed for  moderate pain or severe pain. 40 tablet 0  . IRON PO Take 1 tablet by mouth daily.    [name] MAGNESIUM PO Take 1 tablet by mouth daily.    . Multiple Vitamin (MULTIVITAMIN WITH MINERALS) TABS tablet Take 1 tablet by mouth daily.    . Omega-3 Fatty Acids (FISH OIL) 1000 MG CAPS Take 1,000 mg by mouth daily.    [name] OVER THE COUNTER MEDICATION Place 1 drop into both eyes 2 (two) times daily. Over the counter lubricating eye drop    . tamsulosin (FLOMAX) 0.4 MG CAPS capsule Take 0.4 mg by mouth at bedtime.   11  . TURMERIC PO Take 2 capsules by mouth daily.    . vitamin E (VITAMIN E) 400 UNIT capsule Take 400 Units by mouth daily.      . metoprolol tartrate (LOPRESSOR) 25 MG tablet Take 1 tablet (25 mg total) by mouth 2 (two) times daily. 180 tablet 3   No facility-administered medications prior to visit.      Allergies:   Codeine and Morphine and related   Social History   Social History  . Marital status: Divorced    Spouse name: N/A  . Number of children: 2  . Years of education: N/A   Occupational History  . Dentist    Social History Main Topics  . Smoking status: Never Smoker  . Smokeless tobacco: Never Used  . Alcohol use 1.2 oz/week    2 Glasses of wine per week     Comment:    . Drug use: No  . Sexual activity: Not Asked   Other Topics Concern  . None   Social History Narrative   Divorced, 2 sons and 1 daughter.   Lives in [place].   Occupation: Pharmacist, community in [place], [place].   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   No tobacco, no alchol, no drugs.   Raises pigeons     Family History:  The patient's family history includes Diabetes in his brother; Heart disease (age of onset: 38) in his father.   ROS:   Please see the history of present illness.   Excessive sweating, fatigue, skipped heartbeats, dizziness, cough, shortness of breath with activity ROS All other systems reviewed and are negative.   PHYSICAL EXAM:   VS:  BP 116/70   Pulse 60   Ht 6\' 7"  (2.007 m)   Wt 211 lb  12.8 oz (96.1 kg)   BMI 23.86 kg/m    GEN: Well nourished, well developed, tall in no acute distress HEENT: normal Neck: no JVD, carotid bruits, or masses Cardiac: Irreg; tachy, no murmurs, rubs, or gallops,no edema  Respiratory:  clear to auscultation bilaterally, normal work of breathing GI: soft, nontender, nondistended, + BS MS: no deformity or atrophy Skin: warm and dry, no rash Neuro:  Alert and Oriented x 3, Strength and  sensation are intact Psych: euthymic mood, full affect  Wt Readings from Last 3 Encounters:  02/04/16 211 lb 12.8 oz (96.1 kg)  11/24/15 209 lb (94.8 kg)  09/24/15 213 lb 8 oz (96.8 kg)      Studies/Labs Reviewed:   EKG:  EKG is ordered today. 06/30/15-heart rate 132 bpm with atrial flutter, typical appearance.  Recent Labs: 06/30/2015: TSH 1.31 11/19/2015: ALT 40; BUN 17; Creatinine, Ser 0.97; Hemoglobin 13.2; Platelets 343; Potassium 5.2; Sodium 136   Lipid Panel    Component Value Date/Time   CHOL 100 01/27/2011 1450   TRIG 70 01/27/2011 1450   HDL 55 01/27/2011 1450   CHOLHDL 1.8 01/27/2011 1450   VLDL 14 01/27/2011 1450   LDLCALC 31 01/27/2011 1450    Additional studies/ records that were reviewed today include:   EKGs reviewed.07/23/15-sinus rhythm, 63, no other abnormalities. Personally viewed.    ASSESSMENT:    1. Atrial flutter, unspecified type ([place])      PLAN:  In order of problems listed above:  Atrial flutter -Successful TEE cardioversion on 07/05/15. -Newly discovered 06/30/15. He's had potentially 3 weeks of consistent flutter at 130 bpm however. Asymptomatic. Feeling short of breath with activity.   Xarelto 20 mg at nightWas given at that time. He will stop his tumeric and fish oil supplements. Understands risks of bleeding, discussed stroke risk. -CHADSVASC - 1 (age) -I'm come to go with him trying to taper off the metoprolol this point, 25 mg twice a day down to 12.5 mg twice a day for 3 days then stop.. -TSH, free T4,  complete metabolic profile and CBC were normal. -He did have a bout of bronchitis which may have triggered this recent bout although he seems to have had a natural course of more and more duration of paroxysmal atrial flutter over the last year. -We discussed starting or stopping Xarelto and pros and cons, risk or of 1. He came off of this for weeks after his cardioversion. This is reasonable secondary to her guidelines. If atrial flutter were to occur once again, we would need to restart anticoagulation and refer for ablative therapy since he has a typical flutter. He is also concerned about continuing anti-coag because of his prior hematuria.  Dyspnea on exertion -Secondary to atrial flutter. Feels much better.  BPH -He previously had some bleeding in his bladder. Dr. Gaynelle Arabian has performed thorough workup.  Finasteride has helped him significant with his prostate.  Hernia repair successful.  We will see back in one year. He will let us know if he has any further episodes of atrial flutter. Excellent.    Medication Adjustments/Labs and Tests Ordered: Current medicines are reviewed at length with the patient today.  Concerns regarding medicines are outlined above.  Medication changes, Labs and Tests ordered today are listed in the Patient Instructions below. Patient Instructions  Medication Instructions:  Please decrease your Metoprolol to 12.5 mg twice a day for 3 days then stop. Continue all other medications as listed.  Follow-Up: Follow up in 1 year with Dr. Marlou Porch.  You will receive a letter in the mail 2 months before you are due.  Please call us when you receive this letter to schedule your follow up appointment.  If you need a refill on your cardiac medications before your next appointment, please call your pharmacy.  Thank you for choosing [facility]!!        Signed, Candee Furbish, MD  02/04/2016 11:43 AM    [facility] [address], [place], [place]  [address] Phone: [phone]; Fax: [phone]

## 2016-02-06 ENCOUNTER — Encounter: Payer: Self-pay | Admitting: Family Medicine

## 2016-02-11 DIAGNOSIS — M5412 Radiculopathy, cervical region: Secondary | ICD-10-CM | POA: Diagnosis not present

## 2016-02-11 DIAGNOSIS — M542 Cervicalgia: Secondary | ICD-10-CM | POA: Diagnosis not present

## 2016-02-28 DIAGNOSIS — K862 Cyst of pancreas: Secondary | ICD-10-CM

## 2016-02-28 HISTORY — DX: Cyst of pancreas: K86.2

## 2016-03-03 DIAGNOSIS — R351 Nocturia: Secondary | ICD-10-CM | POA: Diagnosis not present

## 2016-03-03 DIAGNOSIS — N486 Induration penis plastica: Secondary | ICD-10-CM | POA: Diagnosis not present

## 2016-03-03 DIAGNOSIS — N401 Enlarged prostate with lower urinary tract symptoms: Secondary | ICD-10-CM | POA: Diagnosis not present

## 2016-03-03 DIAGNOSIS — E291 Testicular hypofunction: Secondary | ICD-10-CM | POA: Diagnosis not present

## 2016-03-11 DIAGNOSIS — M75121 Complete rotator cuff tear or rupture of right shoulder, not specified as traumatic: Secondary | ICD-10-CM | POA: Diagnosis not present

## 2016-03-24 DIAGNOSIS — N486 Induration penis plastica: Secondary | ICD-10-CM | POA: Diagnosis not present

## 2016-03-24 DIAGNOSIS — E291 Testicular hypofunction: Secondary | ICD-10-CM | POA: Diagnosis not present

## 2016-03-24 DIAGNOSIS — R310 Gross hematuria: Secondary | ICD-10-CM | POA: Diagnosis not present

## 2016-03-31 DIAGNOSIS — R310 Gross hematuria: Secondary | ICD-10-CM | POA: Diagnosis not present

## 2016-03-31 DIAGNOSIS — R3129 Other microscopic hematuria: Secondary | ICD-10-CM | POA: Diagnosis not present

## 2016-04-07 DIAGNOSIS — Z0000 Encounter for general adult medical examination without abnormal findings: Secondary | ICD-10-CM | POA: Diagnosis not present

## 2016-04-07 DIAGNOSIS — R351 Nocturia: Secondary | ICD-10-CM | POA: Diagnosis not present

## 2016-04-07 DIAGNOSIS — N401 Enlarged prostate with lower urinary tract symptoms: Secondary | ICD-10-CM | POA: Diagnosis not present

## 2016-04-07 DIAGNOSIS — E291 Testicular hypofunction: Secondary | ICD-10-CM | POA: Diagnosis not present

## 2016-04-07 DIAGNOSIS — N486 Induration penis plastica: Secondary | ICD-10-CM | POA: Diagnosis not present

## 2016-04-07 DIAGNOSIS — R310 Gross hematuria: Secondary | ICD-10-CM | POA: Diagnosis not present

## 2016-05-19 DIAGNOSIS — M24111 Other articular cartilage disorders, right shoulder: Secondary | ICD-10-CM | POA: Diagnosis not present

## 2016-05-19 DIAGNOSIS — M7541 Impingement syndrome of right shoulder: Secondary | ICD-10-CM | POA: Diagnosis not present

## 2016-05-19 DIAGNOSIS — G8918 Other acute postprocedural pain: Secondary | ICD-10-CM | POA: Diagnosis not present

## 2016-05-19 DIAGNOSIS — D1721 Benign lipomatous neoplasm of skin and subcutaneous tissue of right arm: Secondary | ICD-10-CM | POA: Diagnosis not present

## 2016-05-19 DIAGNOSIS — D1739 Benign lipomatous neoplasm of skin and subcutaneous tissue of other sites: Secondary | ICD-10-CM | POA: Diagnosis not present

## 2016-05-19 DIAGNOSIS — M75121 Complete rotator cuff tear or rupture of right shoulder, not specified as traumatic: Secondary | ICD-10-CM | POA: Diagnosis not present

## 2016-05-19 HISTORY — PX: ROTATOR CUFF REPAIR: SHX139

## 2016-05-28 DIAGNOSIS — M009 Pyogenic arthritis, unspecified: Secondary | ICD-10-CM

## 2016-05-28 HISTORY — DX: Pyogenic arthritis, unspecified: M00.9

## 2016-06-06 DIAGNOSIS — M75121 Complete rotator cuff tear or rupture of right shoulder, not specified as traumatic: Secondary | ICD-10-CM | POA: Diagnosis not present

## 2016-06-09 DIAGNOSIS — M25511 Pain in right shoulder: Secondary | ICD-10-CM | POA: Diagnosis not present

## 2016-06-09 DIAGNOSIS — M75111 Incomplete rotator cuff tear or rupture of right shoulder, not specified as traumatic: Secondary | ICD-10-CM | POA: Diagnosis not present

## 2016-06-14 DIAGNOSIS — M25511 Pain in right shoulder: Secondary | ICD-10-CM | POA: Diagnosis not present

## 2016-06-14 DIAGNOSIS — M75111 Incomplete rotator cuff tear or rupture of right shoulder, not specified as traumatic: Secondary | ICD-10-CM | POA: Diagnosis not present

## 2016-06-16 DIAGNOSIS — M75111 Incomplete rotator cuff tear or rupture of right shoulder, not specified as traumatic: Secondary | ICD-10-CM | POA: Diagnosis not present

## 2016-06-16 DIAGNOSIS — M25511 Pain in right shoulder: Secondary | ICD-10-CM | POA: Diagnosis not present

## 2016-06-21 ENCOUNTER — Inpatient Hospital Stay (HOSPITAL_COMMUNITY): Payer: Medicare Other | Admitting: Anesthesiology

## 2016-06-21 ENCOUNTER — Other Ambulatory Visit: Payer: Self-pay | Admitting: Orthopedic Surgery

## 2016-06-21 ENCOUNTER — Encounter (HOSPITAL_COMMUNITY): Admission: AD | Disposition: A | Discharge status: Home Health | Payer: Self-pay | Attending: Orthopedic Surgery

## 2016-06-21 ENCOUNTER — Encounter (HOSPITAL_COMMUNITY): Payer: Self-pay | Admitting: *Deleted

## 2016-06-21 ENCOUNTER — Inpatient Hospital Stay (HOSPITAL_COMMUNITY)
Admission: AD | Admit: 2016-06-21 | Discharge: 2016-06-24 | DRG: 857 | Disposition: A | Discharge status: Home Health | Payer: Medicare Other | Attending: Orthopedic Surgery | Admitting: Orthopedic Surgery

## 2016-06-21 DIAGNOSIS — Z96659 Presence of unspecified artificial knee joint: Secondary | ICD-10-CM | POA: Diagnosis present

## 2016-06-21 DIAGNOSIS — M1990 Unspecified osteoarthritis, unspecified site: Secondary | ICD-10-CM | POA: Diagnosis present

## 2016-06-21 DIAGNOSIS — M009 Pyogenic arthritis, unspecified: Secondary | ICD-10-CM | POA: Diagnosis present

## 2016-06-21 DIAGNOSIS — X58XXXA Exposure to other specified factors, initial encounter: Secondary | ICD-10-CM | POA: Diagnosis present

## 2016-06-21 DIAGNOSIS — G8918 Other acute postprocedural pain: Secondary | ICD-10-CM | POA: Diagnosis not present

## 2016-06-21 DIAGNOSIS — M67912 Unspecified disorder of synovium and tendon, left shoulder: Secondary | ICD-10-CM | POA: Diagnosis not present

## 2016-06-21 DIAGNOSIS — T814XXA Infection following a procedure, initial encounter: Secondary | ICD-10-CM | POA: Diagnosis not present

## 2016-06-21 DIAGNOSIS — Z9889 Other specified postprocedural states: Secondary | ICD-10-CM | POA: Diagnosis not present

## 2016-06-21 DIAGNOSIS — Z85820 Personal history of malignant melanoma of skin: Secondary | ICD-10-CM

## 2016-06-21 DIAGNOSIS — Z885 Allergy status to narcotic agent status: Secondary | ICD-10-CM | POA: Diagnosis not present

## 2016-06-21 DIAGNOSIS — Z833 Family history of diabetes mellitus: Secondary | ICD-10-CM

## 2016-06-21 DIAGNOSIS — I4891 Unspecified atrial fibrillation: Secondary | ICD-10-CM | POA: Diagnosis present

## 2016-06-21 DIAGNOSIS — Z8249 Family history of ischemic heart disease and other diseases of the circulatory system: Secondary | ICD-10-CM

## 2016-06-21 DIAGNOSIS — I10 Essential (primary) hypertension: Secondary | ICD-10-CM | POA: Diagnosis present

## 2016-06-21 DIAGNOSIS — M75121 Complete rotator cuff tear or rupture of right shoulder, not specified as traumatic: Secondary | ICD-10-CM | POA: Diagnosis not present

## 2016-06-21 HISTORY — PX: IRRIGATION AND DEBRIDEMENT SHOULDER: SHX5880

## 2016-06-21 LAB — BASIC METABOLIC PANEL
Anion gap: 10 (ref 5–15)
BUN: 18 mg/dL (ref 6–20)
CHLORIDE: 99 mmol/L — AB (ref 101–111)
CO2: 24 mmol/L (ref 22–32)
Calcium: 9.5 mg/dL (ref 8.9–10.3)
Creatinine, Ser: 0.8 mg/dL (ref 0.61–1.24)
Glucose, Bld: 95 mg/dL (ref 65–99)
POTASSIUM: 3.9 mmol/L (ref 3.5–5.1)
Sodium: 133 mmol/L — ABNORMAL LOW (ref 135–145)

## 2016-06-21 LAB — CBC
HCT: 34.4 % — ABNORMAL LOW (ref 39.0–52.0)
HEMOGLOBIN: 11.7 g/dL — AB (ref 13.0–17.0)
MCH: 30.2 pg (ref 26.0–34.0)
MCHC: 34 g/dL (ref 30.0–36.0)
MCV: 88.7 fL (ref 78.0–100.0)
PLATELETS: 410 10*3/uL — AB (ref 150–400)
RBC: 3.88 MIL/uL — AB (ref 4.22–5.81)
RDW: 13.3 % (ref 11.5–15.5)
WBC: 6.4 10*3/uL (ref 4.0–10.5)

## 2016-06-21 LAB — SURGICAL PCR SCREEN
MRSA, PCR: NEGATIVE
STAPHYLOCOCCUS AUREUS: NEGATIVE

## 2016-06-21 SURGERY — IRRIGATION AND DEBRIDEMENT SHOULDER
Anesthesia: General | Site: Shoulder | Laterality: Right

## 2016-06-21 MED ORDER — VANCOMYCIN HCL 10 G IV SOLR
1500.0000 mg | Freq: Two times a day (BID) | INTRAVENOUS | Status: DC
  Administered 2016-06-22 – 2016-06-24 (×5): 1500 mg via INTRAVENOUS
  Filled 2016-06-21 (×6): qty 1500

## 2016-06-21 MED ORDER — METOCLOPRAMIDE HCL 5 MG PO TABS: 5.0000 mg | ORAL_TABLET | Freq: Three times a day (TID) | ORAL | Status: DC | PRN

## 2016-06-21 MED ORDER — LACTATED RINGERS IV SOLN
Freq: Once | INTRAVENOUS | Status: AC
  Administered 2016-06-21: 15:00:00 via INTRAVENOUS

## 2016-06-21 MED ORDER — FENTANYL CITRATE (PF) 100 MCG/2ML IJ SOLN
INTRAMUSCULAR | Status: AC
  Administered 2016-06-21: 50 ug via INTRAVENOUS
  Filled 2016-06-21: qty 2

## 2016-06-21 MED ORDER — ONDANSETRON HCL 4 MG/2ML IJ SOLN: 4.0000 mg | Freq: Four times a day (QID) | INTRAMUSCULAR | Status: DC | PRN

## 2016-06-21 MED ORDER — VANCOMYCIN HCL IN DEXTROSE 750-5 MG/150ML-% IV SOLN
750.0000 mg | Freq: Once | INTRAVENOUS | Status: AC
  Administered 2016-06-21: 750 mg via INTRAVENOUS
  Filled 2016-06-21: qty 150

## 2016-06-21 MED ORDER — EPHEDRINE SULFATE 50 MG/ML IJ SOLN
INTRAMUSCULAR | Status: DC | PRN
  Administered 2016-06-21 (×3): 10 mg via INTRAVENOUS

## 2016-06-21 MED ORDER — BUPIVACAINE-EPINEPHRINE (PF) 0.5% -1:200000 IJ SOLN
INTRAMUSCULAR | Status: DC | PRN
  Administered 2016-06-21: 25 mL via PERINEURAL

## 2016-06-21 MED ORDER — DIPHENHYDRAMINE HCL 12.5 MG/5ML PO ELIX: 12.5000 mg | ORAL_SOLUTION | ORAL | Status: DC | PRN

## 2016-06-21 MED ORDER — METOCLOPRAMIDE HCL 5 MG/ML IJ SOLN: 5.0000 mg | Freq: Three times a day (TID) | INTRAMUSCULAR | Status: DC | PRN

## 2016-06-21 MED ORDER — METHOCARBAMOL 1000 MG/10ML IJ SOLN
500.0000 mg | Freq: Four times a day (QID) | INTRAMUSCULAR | Status: DC | PRN
  Filled 2016-06-21: qty 5

## 2016-06-21 MED ORDER — HYDROCODONE-ACETAMINOPHEN 10-325 MG PO TABS: 1.0000 | ORAL_TABLET | ORAL | Status: DC | PRN

## 2016-06-21 MED ORDER — SENNOSIDES-DOCUSATE SODIUM 8.6-50 MG PO TABS
1.0000 | ORAL_TABLET | Freq: Every evening | ORAL | Status: DC | PRN
  Filled 2016-06-21: qty 1

## 2016-06-21 MED ORDER — MIDAZOLAM HCL 2 MG/2ML IJ SOLN
1.0000 mg | Freq: Once | INTRAMUSCULAR | Status: AC
  Administered 2016-06-21: 1 mg via INTRAVENOUS

## 2016-06-21 MED ORDER — PROPOFOL 10 MG/ML IV BOLUS
INTRAVENOUS | Status: AC
  Filled 2016-06-21: qty 20

## 2016-06-21 MED ORDER — POVIDONE-IODINE 10 % EX SWAB
2.0000 "application " | Freq: Once | CUTANEOUS | Status: AC
  Administered 2016-06-21: 2 via TOPICAL

## 2016-06-21 MED ORDER — METHOCARBAMOL 500 MG PO TABS: 500.0000 mg | ORAL_TABLET | Freq: Four times a day (QID) | ORAL | Status: DC | PRN

## 2016-06-21 MED ORDER — HYDROMORPHONE HCL 1 MG/ML IJ SOLN
1.0000 mg | INTRAMUSCULAR | Status: DC | PRN
  Filled 2016-06-21: qty 1

## 2016-06-21 MED ORDER — DEXTROSE-NACL 5-0.45 % IV SOLN: INTRAVENOUS | Status: DC

## 2016-06-21 MED ORDER — CEFAZOLIN SODIUM 10 G IJ SOLR
3.0000 g | INTRAMUSCULAR | Status: AC
  Administered 2016-06-21: 3 g via INTRAVENOUS
  Filled 2016-06-21: qty 3000

## 2016-06-21 MED ORDER — SUCCINYLCHOLINE CHLORIDE 20 MG/ML IJ SOLN
INTRAMUSCULAR | Status: DC | PRN
  Administered 2016-06-21: 160 mg via INTRAVENOUS

## 2016-06-21 MED ORDER — VANCOMYCIN HCL IN DEXTROSE 1-5 GM/200ML-% IV SOLN
INTRAVENOUS | Status: AC
  Filled 2016-06-21: qty 200

## 2016-06-21 MED ORDER — MIDAZOLAM HCL 2 MG/2ML IJ SOLN: 2.0000 mg | Freq: Once | INTRAMUSCULAR | Status: DC

## 2016-06-21 MED ORDER — HYDROMORPHONE HCL 1 MG/ML IJ SOLN: 0.2500 mg | INTRAMUSCULAR | Status: DC | PRN

## 2016-06-21 MED ORDER — 0.9 % SODIUM CHLORIDE (POUR BTL) OPTIME
TOPICAL | Status: DC | PRN
  Administered 2016-06-21: 1000 mL

## 2016-06-21 MED ORDER — CHLORHEXIDINE GLUCONATE 4 % EX LIQD: 60.0000 mL | Freq: Once | CUTANEOUS | Status: DC

## 2016-06-21 MED ORDER — CEFAZOLIN SODIUM-DEXTROSE 2-4 GM/100ML-% IV SOLN
2.0000 g | Freq: Four times a day (QID) | INTRAVENOUS | Status: AC
  Administered 2016-06-22 (×3): 2 g via INTRAVENOUS
  Filled 2016-06-21 (×3): qty 100

## 2016-06-21 MED ORDER — FENTANYL CITRATE (PF) 250 MCG/5ML IJ SOLN
INTRAMUSCULAR | Status: AC
  Filled 2016-06-21: qty 5

## 2016-06-21 MED ORDER — MELOXICAM 7.5 MG PO TABS
7.5000 mg | ORAL_TABLET | Freq: Every day | ORAL | Status: DC
  Administered 2016-06-22: 7.5 mg via ORAL
  Filled 2016-06-21: qty 1

## 2016-06-21 MED ORDER — ACETAMINOPHEN 325 MG PO TABS
650.0000 mg | ORAL_TABLET | Freq: Four times a day (QID) | ORAL | Status: DC | PRN
  Administered 2016-06-22 (×2): 650 mg via ORAL
  Filled 2016-06-21 (×2): qty 2

## 2016-06-21 MED ORDER — PROMETHAZINE HCL 25 MG/ML IJ SOLN: 6.2500 mg | INTRAMUSCULAR | Status: DC | PRN

## 2016-06-21 MED ORDER — LACTATED RINGERS IV SOLN
INTRAVENOUS | Status: DC | PRN
  Administered 2016-06-21 (×2): via INTRAVENOUS

## 2016-06-21 MED ORDER — SODIUM CHLORIDE 0.9 % IV SOLN
INTRAVENOUS | Status: DC | PRN
  Administered 2016-06-21: 1000 mg via INTRAVENOUS

## 2016-06-21 MED ORDER — KCL IN DEXTROSE-NACL 20-5-0.45 MEQ/L-%-% IV SOLN
INTRAVENOUS | Status: DC
  Administered 2016-06-22: 100 mL/h via INTRAVENOUS
  Filled 2016-06-21 (×2): qty 1000

## 2016-06-21 MED ORDER — MIDAZOLAM HCL 2 MG/2ML IJ SOLN
INTRAMUSCULAR | Status: AC
  Administered 2016-06-21: 1 mg via INTRAVENOUS
  Filled 2016-06-21: qty 2

## 2016-06-21 MED ORDER — BISACODYL 5 MG PO TBEC: 5.0000 mg | DELAYED_RELEASE_TABLET | Freq: Every day | ORAL | Status: DC | PRN

## 2016-06-21 MED ORDER — LIDOCAINE HCL (CARDIAC) 20 MG/ML IV SOLN
INTRAVENOUS | Status: DC | PRN
  Administered 2016-06-21: 100 mg via INTRAVENOUS

## 2016-06-21 MED ORDER — MAGNESIUM CITRATE PO SOLN: 1.0000 | Freq: Once | ORAL | Status: DC | PRN

## 2016-06-21 MED ORDER — ACETAMINOPHEN 650 MG RE SUPP: 650.0000 mg | Freq: Four times a day (QID) | RECTAL | Status: DC | PRN

## 2016-06-21 MED ORDER — ONDANSETRON HCL 4 MG/2ML IJ SOLN
INTRAMUSCULAR | Status: DC | PRN
  Administered 2016-06-21: 4 mg via INTRAVENOUS

## 2016-06-21 MED ORDER — PROPOFOL 10 MG/ML IV BOLUS
INTRAVENOUS | Status: DC | PRN
  Administered 2016-06-21: 200 mg via INTRAVENOUS

## 2016-06-21 MED ORDER — ONDANSETRON HCL 4 MG PO TABS
4.0000 mg | ORAL_TABLET | Freq: Four times a day (QID) | ORAL | Status: DC | PRN
  Filled 2016-06-21: qty 1

## 2016-06-21 MED ORDER — DEXAMETHASONE SODIUM PHOSPHATE 4 MG/ML IJ SOLN
INTRAMUSCULAR | Status: DC | PRN
  Administered 2016-06-21: 10 mg via INTRAVENOUS

## 2016-06-21 MED ORDER — DOCUSATE SODIUM 100 MG PO CAPS
100.0000 mg | ORAL_CAPSULE | Freq: Two times a day (BID) | ORAL | Status: DC
  Administered 2016-06-21 – 2016-06-23 (×4): 100 mg via ORAL
  Filled 2016-06-21 (×6): qty 1

## 2016-06-21 MED ORDER — FENTANYL CITRATE (PF) 100 MCG/2ML IJ SOLN: 100.0000 ug | Freq: Once | INTRAMUSCULAR | Status: DC

## 2016-06-21 MED ORDER — METOPROLOL TARTRATE 25 MG PO TABS
25.0000 mg | ORAL_TABLET | Freq: Two times a day (BID) | ORAL | Status: DC
  Administered 2016-06-21 – 2016-06-24 (×6): 25 mg via ORAL
  Filled 2016-06-21 (×6): qty 1

## 2016-06-21 MED ORDER — FENTANYL CITRATE (PF) 100 MCG/2ML IJ SOLN
50.0000 ug | Freq: Once | INTRAMUSCULAR | Status: AC
  Administered 2016-06-21: 50 ug via INTRAVENOUS

## 2016-06-21 MED ORDER — FENTANYL CITRATE (PF) 100 MCG/2ML IJ SOLN
INTRAMUSCULAR | Status: DC | PRN
  Administered 2016-06-21: 100 ug via INTRAVENOUS

## 2016-06-21 SURGICAL SUPPLY — 60 items
ANCHOR SUT BIOCOMP CORKSREW (Anchor) ×3 IMPLANT
BLADE AVERAGE 25MMX9MM (BLADE)
BLADE AVERAGE 25X9 (BLADE) IMPLANT
COVER SURGICAL LIGHT HANDLE (MISCELLANEOUS) ×6 IMPLANT
DRAPE IMP U-DRAPE 54X76 (DRAPES) ×3 IMPLANT
DRAPE INCISE IOBAN 66X45 STRL (DRAPES) ×3 IMPLANT
DRAPE U-SHAPE 47X51 STRL (DRAPES) ×6 IMPLANT
DRSG PAD ABDOMINAL 8X10 ST (GAUZE/BANDAGES/DRESSINGS) ×6 IMPLANT
DURAPREP 26ML APPLICATOR (WOUND CARE) ×3 IMPLANT
ELECT CAUTERY BLADE 6.4 (BLADE) ×3 IMPLANT
ELECT REM PT RETURN 9FT ADLT (ELECTROSURGICAL) ×3
ELECTRODE REM PT RTRN 9FT ADLT (ELECTROSURGICAL) ×1 IMPLANT
EVACUATOR 1/8 PVC DRAIN (DRAIN) ×3 IMPLANT
GAUZE SPONGE 4X4 12PLY STRL (GAUZE/BANDAGES/DRESSINGS) ×3 IMPLANT
GAUZE SPONGE 4X4 12PLY STRL LF (GAUZE/BANDAGES/DRESSINGS) ×3 IMPLANT
GAUZE XEROFORM 1X8 LF (GAUZE/BANDAGES/DRESSINGS) ×3 IMPLANT
GLOVE BIO SURGEON STRL SZ 6.5 (GLOVE) ×2 IMPLANT
GLOVE BIO SURGEON STRL SZ7.5 (GLOVE) ×9 IMPLANT
GLOVE BIO SURGEON STRL SZ8.5 (GLOVE) ×9 IMPLANT
GLOVE BIO SURGEONS STRL SZ 6.5 (GLOVE) ×1
GLOVE BIOGEL PI IND STRL 6.5 (GLOVE) ×1 IMPLANT
GLOVE BIOGEL PI IND STRL 8 (GLOVE) ×1 IMPLANT
GLOVE BIOGEL PI IND STRL 9 (GLOVE) ×1 IMPLANT
GLOVE BIOGEL PI INDICATOR 6.5 (GLOVE) ×2
GLOVE BIOGEL PI INDICATOR 8 (GLOVE) ×2
GLOVE BIOGEL PI INDICATOR 9 (GLOVE) ×2
GOWN STRL REUS W/ TWL LRG LVL3 (GOWN DISPOSABLE) ×3 IMPLANT
GOWN STRL REUS W/ TWL XL LVL3 (GOWN DISPOSABLE) ×1 IMPLANT
GOWN STRL REUS W/TWL LRG LVL3 (GOWN DISPOSABLE) ×6
GOWN STRL REUS W/TWL XL LVL3 (GOWN DISPOSABLE) ×2
HYDROGEN PEROXIDE 16OZ (MISCELLANEOUS) ×3 IMPLANT
KIT BASIN OR (CUSTOM PROCEDURE TRAY) ×3 IMPLANT
KIT ROOM TURNOVER OR (KITS) ×3 IMPLANT
MANIFOLD NEPTUNE II (INSTRUMENTS) ×3 IMPLANT
NEEDLE 1/2 CIR CATGUT .05X1.09 (NEEDLE) IMPLANT
NEEDLE HYPO 25GX1X1/2 BEV (NEEDLE) IMPLANT
NS IRRIG 1000ML POUR BTL (IV SOLUTION) ×3 IMPLANT
PACK SHOULDER (CUSTOM PROCEDURE TRAY) ×3 IMPLANT
PACK UNIVERSAL I (CUSTOM PROCEDURE TRAY) ×3 IMPLANT
PAD ARMBOARD 7.5X6 YLW CONV (MISCELLANEOUS) ×6 IMPLANT
PASSER SUT SWANSON 36MM LOOP (INSTRUMENTS) IMPLANT
SPONGE LAP 4X18 X RAY DECT (DISPOSABLE) ×6 IMPLANT
SUCTION FRAZIER HANDLE 10FR (MISCELLANEOUS) ×4
SUCTION TUBE FRAZIER 10FR DISP (MISCELLANEOUS) ×2 IMPLANT
SUT ETHIBOND 2 OS 4 DA (SUTURE) IMPLANT
SUT ETHILON 2 0 FS 18 (SUTURE) ×6 IMPLANT
SUT FIBERWIRE #2 38 REV NDL BL (SUTURE)
SUT VIC AB 0 CT1 27 (SUTURE) ×4
SUT VIC AB 0 CT1 27XBRD ANBCTR (SUTURE) ×2 IMPLANT
SUT VIC AB 2-0 CT1 27 (SUTURE) ×4
SUT VIC AB 2-0 CT1 TAPERPNT 27 (SUTURE) ×2 IMPLANT
SUT VIC AB 3-0 FS2 27 (SUTURE) ×6 IMPLANT
SUTURE FIBERWR#2 38 REV NDL BL (SUTURE) IMPLANT
SYR CONTROL 10ML LL (SYRINGE) IMPLANT
TAPE CLOTH SURG 6X10 WHT LF (GAUZE/BANDAGES/DRESSINGS) ×3 IMPLANT
TOWEL OR 17X24 6PK STRL BLUE (TOWEL DISPOSABLE) ×3 IMPLANT
TOWEL OR 17X26 10 PK STRL BLUE (TOWEL DISPOSABLE) ×3 IMPLANT
TUBE CONNECTING 12'X1/4 (SUCTIONS) ×1
TUBE CONNECTING 12X1/4 (SUCTIONS) ×2 IMPLANT
WATER STERILE IRR 1000ML POUR (IV SOLUTION) ×3 IMPLANT

## 2016-06-21 NOTE — Anesthesia Procedure Notes (Signed)
Anesthesia Regional Block: Interscalene brachial plexus block   Pre-Anesthetic Checklist: ,, timeout performed, Correct Patient, Correct Site, Correct Laterality, Correct Procedure, Correct Position, site marked, Risks and benefits discussed,  Surgical consent,  Pre-op evaluation,  At surgeon's request and post-op pain management  Laterality: Right  Prep: chloraprep       Needles:  Injection technique: Single-shot  Needle Type: Echogenic Stimulator Needle     Needle Length: 9cm  Needle Gauge: 21     Additional Needles:   Procedures: ultrasound guided, nerve stimulator,,,,,,   Nerve Stimulator or Paresthesia:  Response: deltoid and biceps, 0.5 mA,   Additional Responses:   Narrative:  Start time: 06/21/2016 3:15 PM End time: 06/21/2016 3:22 PM Injection made incrementally with aspirations every 5 mL.  Performed by: Personally  Anesthesiologist: Suzette Battiest

## 2016-06-21 NOTE — Transfer of Care (Signed)
Immediate Anesthesia Transfer of Care Note  Patient: Myra Gianotti, DDS  Procedure(s) Performed: Procedure(s): IRRIGATION AND DEBRIDEMENT SHOULDER (Right)  Patient Location: PACU  Anesthesia Type:GA combined with regional for post-op pain  Level of Consciousness: awake, alert  and oriented  Airway & Oxygen Therapy: Patient Spontanous Breathing and Patient connected to nasal cannula oxygen  Post-op Assessment: Report given to RN and Post -op Vital signs reviewed and stable  Post vital signs: Reviewed and stable  Last Vitals:  Vitals:   06/21/16 1540 06/21/16 1718  BP:  (!) 149/76  Pulse: (!) 56 82  Resp: 15 14  Temp:  36.3 C    Last Pain:  Vitals:   06/21/16 1718  TempSrc:   PainSc: 0-No pain      Patients Stated Pain Goal: 3 (08/20/74 2831)  Complications: No apparent anesthesia complications

## 2016-06-21 NOTE — Anesthesia Procedure Notes (Signed)
Procedure Name: Intubation Date/Time: 06/21/2016 3:59 PM Performed by: Ollen Bowl Pre-anesthesia Checklist: Patient identified, Emergency Drugs available, Suction available, Patient being monitored and Timeout performed Patient Re-evaluated:Patient Re-evaluated prior to inductionOxygen Delivery Method: Circle system utilized and Simple face mask Preoxygenation: Pre-oxygenation with 100% oxygen Intubation Type: IV induction Ventilation: Mask ventilation without difficulty Laryngoscope Size: Miller and 3 Grade View: Grade I Tube type: Oral Tube size: 7.5 mm Number of attempts: 1 Airway Equipment and Method: Patient positioned with wedge pillow and Stylet Placement Confirmation: ETT inserted through vocal cords under direct vision,  positive ETCO2 and breath sounds checked- equal and bilateral Secured at: 24 cm Tube secured with: Tape Dental Injury: Teeth and Oropharynx as per pre-operative assessment

## 2016-06-21 NOTE — Consult Note (Signed)
[place] for Infectious Disease  Date of Admission:  06/21/2016  Date of Consult:  06/21/2016  Reason for Consult: septic shoulder, wound infection Referring Physician: Mayer Camel  Impression/Recommendation Infected Shoulder s/p rotator cuff repair   Would continue vanco Add ceftriaxone for now  Await his Cx Suspect he will need PIC. 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  HPI: 71 yo M with hx of R shoulder mini rotator cuff repair 5 weeks ago. He was doing well til 3 weeks ago then noted pain and swelling over wound site.  He was seen by ortho this Am and purulent fluid aspirated. He was brought to hospital and underwent I & D today. He was given vanco/ancef perioperatively.    Past Medical History:  Diagnosis Date  . Atrial flutter with rapid ventricular response ([place])   . Cervical osteoarthritis 12/2015   Dr. Noemi Chapel got an MRI C spine 12/2015: signif C3-4, C4-5, and C5-6 bulging discs with foraminal stenosis bilat at C5-6--improved on prednisone.  . Complication of anesthesia   . DJD (degenerative joint disease)   . Dysrhythmia    a-flutter with RVR  . PONV (postoperative nausea and vomiting)   . Right inguinal hernia 08/2015   Plan for surgical repair as of 10/12/15 eval by Dr. Dalbert Batman.  . Shoulder pain, bilateral 12/2015   possible acute traumatic poss RC tear: Dr. Noemi Chapel to do R shoulder MRI as of 01/17/16.  . Skin cancer    melanoma X 2; squamous cell X 1  . Urethral stricture 2010   Dr Hartley Barefoot    Past Surgical History:  Procedure Laterality Date  . APPENDECTOMY    . CARDIOVERSION N/A 07/05/2015   For atrial flutter.  Procedure: CARDIOVERSION;  Surgeon: Fay Records, MD;  Location: [facility] ENDOSCOPY;  Service: Cardiovascular;  Laterality: N/A;  . COLONOSCOPY  2002   negative  . INGUINAL HERNIA REPAIR Left   .  INGUINAL HERNIA REPAIR Right 11/24/2015   Procedure: OPEN RIGHT INGUINAL HERNIA REPAIR WITH MESH;  Surgeon: Fanny Skates, MD;  Location: [place];  Service: General;  Laterality: Right;  . INSERTION OF MESH Right 11/24/2015   Procedure: INSERTION OF MESH;  Surgeon: Fanny Skates, MD;  Location: [place];  Service: General;  Laterality: Right;  . JOINT REPLACEMENT Left   . MOHS SURGERY     X 2 of back  . ROTATOR CUFF REPAIR Right 05/19/2016  . TEE WITHOUT CARDIOVERSION N/A 07/05/2015   PFO noted.  Procedure: TRANSESOPHAGEAL ECHOCARDIOGRAM (TEE);  Surgeon: Fay Records, MD;  Location: [facility] ENDOSCOPY;  Service: Cardiovascular;  Laterality: N/A;  . tonsillectomy    . TOTAL KNEE ARTHROPLASTY  2010   Dr Noemi Chapel     Allergies  Allergen Reactions  . Codeine Other (See Comments)    REACTION: angioedema  . Morphine And Related Hives    Medications:  Scheduled: . chlorhexidine  60 mL Topical Once    Abtx:  Anti-infectives    Start     Dose/Rate Route Frequency Ordered Stop   06/22/16 0600  ceFAZolin (ANCEF) 3 g in dextrose 5 % 50 mL IVPB     3 g 130 mL/hr over 30 Minutes Intravenous On call to O.R. 06/21/16 1344 06/21/16 1605   06/21/16 1614  vancomycin (VANCOCIN) 1-5 GM/200ML-% IVPB    Comments:  Laurita Quint   : cabinet override      06/21/16 1614 06/22/16 0429      Total days of antibiotics: 0 vanco/ancef          Social History:  reports that he has never smoked. He has never used smokeless tobacco. He reports that he drinks about 1.2 oz of alcohol per week . He reports that he does not use drugs.  Family History  Problem Relation Age of Onset  . Heart disease Father 23    MI;smoker  . Diabetes Brother     juvenile    General ROS: +chills, sweats over last 5 days. normal bm, normal urine. denies allergies.   Blood pressure (!) 149/76, pulse 82, temperature 97.3 F (36.3 C), resp. rate 14, height 6' 7"  (2.007 m), weight 92.1 kg (203 lb),  SpO2 99 %. General appearance: alert, cooperative and no distress Eyes: negative findings: conjunctivae and sclerae normal and pupils equal, round, reactive to light and accomodation Throat: lips, mucosa, and tongue normal; teeth and gums normal Neck: no adenopathy and supple, symmetrical, trachea midline Lungs: clear to auscultation bilaterally Heart: regular rate and rhythm Abdomen: normal findings: bowel sounds normal and soft, non-tender Extremities: edema none LE.  and R shoulder wrapped.    Results for orders placed or performed during the hospital encounter of 06/21/16 (from the past 48 hour(s))  Surgical pcr screen     Status: None   Collection Time: 06/21/16  1:57 PM  Result Value Ref Range   MRSA, PCR NEGATIVE NEGATIVE   Staphylococcus aureus NEGATIVE NEGATIVE    Comment:        The Xpert SA Assay (FDA approved for NASAL specimens in patients over 19 years of age), is one component of a comprehensive surveillance program.  Test performance has been validated by [facility] for patients greater than or equal to 63 year old. It is not intended to diagnose infection nor to guide or monitor treatment.   CBC     Status: Abnormal   Collection Time: 06/21/16  2:00 PM  Result Value Ref Range   WBC 6.4 4.0 - 10.5 K/uL   RBC 3.88 (L) 4.22 - 5.81 MIL/uL   Hemoglobin 11.7 (L) 13.0 - 17.0 g/dL   HCT 34.4 (L) 39.0 - 52.0 %   MCV 88.7 78.0 - 100.0 fL   MCH 30.2 26.0 - 34.0 pg   MCHC 34.0 30.0 - 36.0 g/dL   RDW 13.3 11.5 - 15.5 %   Platelets 410 (H) 150 - 400 K/uL  Basic metabolic panel     Status: Abnormal   Collection Time: 06/21/16  2:00 PM  Result Value Ref Range   Sodium 133 (L) 135 - 145 mmol/L   Potassium 3.9 3.5 - 5.1 mmol/L   Chloride 99 (L) 101 - 111 mmol/L   CO2 24 22 - 32 mmol/L   Glucose, Bld 95 65 - 99 mg/dL   BUN 18 6 - 20 mg/dL   Creatinine, Ser 0.80 0.61 - 1.24 mg/dL   Calcium 9.5 8.9 - 10.3 mg/dL   GFR calc non Af Amer >60 >60 mL/min   GFR calc Af  Amer >60 >60 mL/min    Comment: (NOTE) The eGFR has been calculated using the CKD EPI equation. This calculation has not been validated in all clinical situations. eGFR's persistently <60 mL/min signify possible Chronic Kidney Disease.    Anion gap 10 5 - 15      Component Value Date/Time  SDES URINE, CLEAN CATCH 02/16/2009 [place] 02/16/2009 0918   CULT NO GROWTH 02/16/2009 0918   REPTSTATUS 02/17/2009 FINAL 02/16/2009 0918   No results found. Recent Results (from the past 240 hour(s))  Surgical pcr screen     Status: None   Collection Time: 06/21/16  1:57 PM  Result Value Ref Range Status   MRSA, PCR NEGATIVE NEGATIVE Final   Staphylococcus aureus NEGATIVE NEGATIVE Final    Comment:        The Xpert SA Assay (FDA approved for NASAL specimens in patients over 78 years of age), is one component of a comprehensive surveillance program.  Test performance has been validated by [facility] for patients greater than or equal to 27 year old. It is not intended to diagnose infection nor to guide or monitor treatment.       06/21/2016, 5:29 PM     LOS: 0 days    Records and images were personally reviewed where available.

## 2016-06-21 NOTE — H&P (Signed)
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  Over the weekend he had some sweats and chills, but no measured fevers.  The swelling is actually increased and he came in today to have a wound check.  He reports that the pain really has not increased at all, but he did stop going to the gym.  He last ate bacon and eggs at 0 730 this morning.  It is now 0 900.  Past Medical History:  Diagnosis Date  . Atrial flutter with rapid ventricular response ([place])   . Cervical osteoarthritis 12/2015   Dr. Noemi Chapel got an MRI C spine 12/2015: signif C3-4, C4-5, and C5-6 bulging discs with foraminal stenosis bilat at C5-6--improved on prednisone.  . Complication of anesthesia   . DJD (degenerative joint disease)   . Dysrhythmia    a-flutter with RVR  . PONV (postoperative nausea and vomiting)   . Right inguinal hernia 08/2015   Plan for surgical repair as of 10/12/15 eval by Dr. Dalbert Batman.  . Shoulder pain, bilateral 12/2015   possible acute traumatic poss RC tear: Dr. Noemi Chapel to do R shoulder MRI as of 01/17/16.  . Skin cancer    melanoma X 2; squamous cell X 1  . Urethral stricture 2010   Dr Hartley Barefoot    Past Surgical History:  Procedure Laterality Date  . APPENDECTOMY    . CARDIOVERSION N/A 07/05/2015   For atrial flutter.  Procedure: CARDIOVERSION;  Surgeon: Fay Records, MD;  Location: [facility] ENDOSCOPY;  Service: Cardiovascular;  Laterality: N/A;  . COLONOSCOPY  2002   negative  . INGUINAL HERNIA REPAIR Left   . INGUINAL HERNIA REPAIR Right 11/24/2015   Procedure: OPEN RIGHT INGUINAL HERNIA REPAIR WITH MESH;  Surgeon: Fanny Skates, MD;  Location: [place];  Service: General;  Laterality: Right;  . INSERTION OF MESH Right 11/24/2015   Procedure: INSERTION OF MESH;  Surgeon: Fanny Skates, MD;  Location: [place];  Service: General;  Laterality: Right;  . JOINT REPLACEMENT Left   . MOHS SURGERY     X 2 of back  . TEE WITHOUT CARDIOVERSION N/A 07/05/2015   PFO noted.  Procedure: TRANSESOPHAGEAL ECHOCARDIOGRAM (TEE);  Surgeon: Fay Records, MD;  Location: [facility] ENDOSCOPY;  Service: Cardiovascular;  Laterality: N/A;  . tonsillectomy    . TOTAL KNEE ARTHROPLASTY  2010   Dr Noemi Chapel    Family History  Problem Relation Age of Onset  . Heart disease Father 31    MI;smoker  . Diabetes Brother     juvenile   Social History:  reports that he has never smoked. He has never used smokeless tobacco. He reports that he drinks about 1.2 oz of alcohol per week . He reports that he does not use drugs.  Allergies:  Allergies  Allergen Reactions  . Codeine Other (See Comments)    REACTION: angioedema  . Morphine And Related Hives    No prescriptions prior to admission.    No results found for this or any previous visit (from the past 48 hour(s)). No results found.  Review of Systems  Constitutional: Negative.   HENT: Negative.   Eyes: Negative.   Respiratory: Negative.   Cardiovascular: Positive for palpitations.       Irregular heart beat  Gastrointestinal: Negative.  Genitourinary:       BPH  Musculoskeletal: Positive for joint pain.  Skin: Negative.   Neurological: Negative.   Endo/Heme/Allergies: Negative.   Psychiatric/Behavioral: Negative.     There were no vitals taken for this visit. Physical Exam  Constitutional: He is oriented to person, place, and time. He appears well-developed and well-nourished.  HENT:  Head: Normocephalic and atraumatic.  Eyes: Pupils are equal, round, and reactive to light.  Neck: Normal range of motion. Neck supple.  Cardiovascular: Intact distal pulses.   Respiratory: Effort normal.  Musculoskeletal: He exhibits edema and tenderness.  The wound is red and angry looking with erythema and I think I can palpate a pus pocket superiorly.  After  discussing options and obtaining informed consent, I sterilely prepped this area with alcohol, anesthetized with ethyl chloride and aspirated 8 cc of purulent material that was immediately sent off for Gram stain and culture as well as cell count.  He has remarkably good passive range of motion, external rotation is to 60, passive flexion to 100, internal rotation to back pocket.    Neurological: He is alert and oriented to person, place, and time.  Skin: Skin is warm and dry. There is erythema.  Psychiatric: He has a normal mood and affect. His behavior is normal. Judgment and thought content normal.     Assessment/Plan Assess: Status post-right shoulder rotator cuff repair 05/19/16 with postoperative wound infection.  At 5 weeks.  Plan: Patient will be admitted to [facility] for urgent irrigation and debridement with placement of drains PICC line and IV antibiotics.  We called the hospital, at this time.  It is full, hopefully we will get his surgery done sometime this afternoon or this evening.  Since have gotten a good sample of material for culture.  We will start him on Keflex 1000 mg by mouth now and then 500 3 times a day going forward.  I will see him at the hospital this afternoon for surgery.  Hopefully.  Cristin Penaflor R, PA-C 06/21/2016, 9:55 AM

## 2016-06-21 NOTE — Anesthesia Preprocedure Evaluation (Addendum)
Anesthesia Evaluation  Patient identified by MRN, date of birth, ID band Patient awake    Reviewed: Allergy & Precautions, H&P , NPO status , Patient's Chart, lab work & pertinent test results, reviewed documented beta blocker date and time   History of Anesthesia Complications (+) PONV  Airway Mallampati: II  TM Distance: >3 FB Neck ROM: full    Dental no notable dental hx.    Pulmonary neg pulmonary ROS,    Pulmonary exam normal breath sounds clear to auscultation       Cardiovascular hypertension, Pt. on home beta blockers + dysrhythmias Atrial Fibrillation  Rhythm:regular Rate:Normal     Neuro/Psych negative neurological ROS     GI/Hepatic negative GI ROS, Neg liver ROS,   Endo/Other  negative endocrine ROS  Renal/GU negative Renal ROS     Musculoskeletal  (+) Arthritis ,   Abdominal   Peds  Hematology negative hematology ROS (+)   Anesthesia Other Findings   Reproductive/Obstetrics                            Lab Results  Component Value Date   WBC 5.0 11/19/2015   HGB 13.2 11/19/2015   HCT 40.1 11/19/2015   MCV 90.1 11/19/2015   PLT 343 11/19/2015   Lab Results  Component Value Date   CREATININE 0.97 11/19/2015   BUN 17 11/19/2015   NA 136 11/19/2015   K 5.2 (H) 11/19/2015   CL 102 11/19/2015   CO2 28 11/19/2015    Anesthesia Physical Anesthesia Plan  ASA: II  Anesthesia Plan: General   Post-op Pain Management:  Regional for Post-op pain   Induction: Intravenous  Airway Management Planned: Oral ETT  Additional Equipment:   Intra-op Plan:   Post-operative Plan: Extubation in OR  Informed Consent: I have reviewed the patients History and Physical, chart, labs and discussed the procedure including the risks, benefits and alternatives for the proposed anesthesia with the patient or authorized representative who has indicated his/her understanding and  acceptance.   Dental advisory given  Plan Discussed with: CRNA  Anesthesia Plan Comments:        Anesthesia Quick Evaluation

## 2016-06-21 NOTE — Progress Notes (Signed)
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  Patient has a history of rotator cuff repair on 05/19/16, then started to have pain and swelling.  He underwent I&D today.  Pharmacy has been consulted for vancomycin dosing for septic shoulder/wound infection.  Noted patient received vancomycin 1gm and Ancef 3gm IV in the OR.  Baseline labs reviewed.   Plan: - Vanc 750mg  IV x 1 (total of 1750mg  load), then 1500mg  IV Q12H - Monitor renal fxn, clinical progress, vanc trough prior to 4th dose - F/U with adding Rocephin   Height: 6\' 7"  (200.7 cm) Weight: 203 lb (92.1 kg) IBW/kg (Calculated) : 93.7  Temp (24hrs), Avg:98 F (36.7 C), Min:97.3 F (36.3 C), Max:98.6 F (37 C)   Recent Labs Lab 06/21/16 1400  WBC 6.4  CREATININE 0.80    Estimated Creatinine Clearance: 115.1 mL/min (by C-G formula based on SCr of 0.8 mg/dL).    Allergies  Allergen Reactions  . Codeine Other (See Comments)    REACTION: angioedema  . Morphine And Related Hives    Vanc 4/25 >>   Lillan Mccreadie D. Mina Marble, PharmD, BCPS Pager:  [phone] 06/21/2016, 7:34 PM

## 2016-06-21 NOTE — Op Note (Signed)
Pre Op Dx: Right shoulder intra-articular infection 5 weeks after mini open rotator cuff repair  Post Op Dx: Same  Procedure: Right shoulder open irrigation and debridement with removal of suture and suture anchor material debridement of necrotic supraspinatus tendon debridement of osteomyelitis around anchor tracks.  Surgeon: Kerin Salen, MD  Assistant: Kerry Hough. 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 He is remained afebrile he has a normal white count however when we saw him in the office this morning I aspirated 10 mL of purulent material came back with many polys no organisms. He is taken for irrigation and debridement of his right shoulder and removal of any loose anchor material or sutures. Risks and benefits of surgery have been discussed.   Procedure: Patient identified by armband and received an interscalene block anesthesia in the holding area, and hospital. He also received 3 g of Ancef intravenously and 1 g of vancomycin intravenously. He was taken to operating room 9 where the appropriate anesthetic monitors were attached and general endotracheal anesthesia was induced he was then placed in the 30 beachchair position and the right upper extremity prepped and draped in usual sterile fashion from the wrist to the hemithorax. A timeout procedure was performed. We re-created the old mini open diagonal incision off the anterolateral acromion and immediately encountered purulent material between the subcutaneous tissue and the deltoid muscle. This was evacuated and sent for Gram stain and culture.  There was a hole in the deltoid raphae communicating with the rotator cuff this was explored and one of the suture anchors noted to be loose and there was purulent material in the glenohumeral joint itself. We then set about removing all of the sutures from the cuff repair and the other 3 anchors. We removed any necrotic material from the supraspinatus tendon approximately 1/4 inch of the tendon. The wound was then irrigated alternating with pulse lavage and hydrogen peroxide. After thorough irrigation and debridement a small Hemovac drain was placed in the subacromial space and another in the subcutaneous space. Side-to-side repair the deltoid muscle was then made a 3-0 Vicryl suture and then a single layer of 3-0 Vicryl suture was placed in the subcutaneous tissue to close the wound. A dressing of Xerofoam 4 x 4 dressing sponges and paper tape was applied the patient was placed in a sling and taken to the recovery without difficulty. The Hemovac drains were sewn into place.

## 2016-06-22 ENCOUNTER — Encounter (HOSPITAL_COMMUNITY): Payer: Self-pay | Admitting: General Practice

## 2016-06-22 LAB — CBC WITH DIFFERENTIAL/PLATELET
BASOS ABS: 0 10*3/uL (ref 0.0–0.1)
BASOS PCT: 0 %
EOS ABS: 0 10*3/uL (ref 0.0–0.7)
Eosinophils Relative: 0 %
HCT: 32.1 % — ABNORMAL LOW (ref 39.0–52.0)
HEMOGLOBIN: 11.1 g/dL — AB (ref 13.0–17.0)
Lymphocytes Relative: 16 %
Lymphs Abs: 1.4 10*3/uL (ref 0.7–4.0)
MCH: 30.2 pg (ref 26.0–34.0)
MCHC: 34.6 g/dL (ref 30.0–36.0)
MCV: 87.2 fL (ref 78.0–100.0)
Monocytes Absolute: 0.5 10*3/uL (ref 0.1–1.0)
Monocytes Relative: 6 %
Neutro Abs: 6.5 10*3/uL (ref 1.7–7.7)
Neutrophils Relative %: 78 %
Platelets: 392 10*3/uL (ref 150–400)
RBC: 3.68 MIL/uL — AB (ref 4.22–5.81)
RDW: 12.8 % (ref 11.5–15.5)
WBC: 8.4 10*3/uL (ref 4.0–10.5)

## 2016-06-22 MED ORDER — PNEUMOCOCCAL VAC POLYVALENT 25 MCG/0.5ML IJ INJ: 0.5000 mL | INJECTION | INTRAMUSCULAR | Status: DC | PRN

## 2016-06-22 MED ORDER — HYPROMELLOSE (GONIOSCOPIC) 2.5 % OP SOLN
1.0000 [drp] | OPHTHALMIC | Status: DC | PRN
  Administered 2016-06-22: 1 [drp] via OPHTHALMIC
  Filled 2016-06-22: qty 15

## 2016-06-22 MED ORDER — DEXTROSE 5 % IV SOLN
2.0000 g | INTRAVENOUS | Status: DC
  Administered 2016-06-22 – 2016-06-23 (×2): 2 g via INTRAVENOUS
  Filled 2016-06-22 (×3): qty 2

## 2016-06-22 MED ORDER — IBUPROFEN 200 MG PO TABS
800.0000 mg | ORAL_TABLET | Freq: Three times a day (TID) | ORAL | Status: DC
  Administered 2016-06-22 – 2016-06-24 (×6): 800 mg via ORAL
  Filled 2016-06-22 (×6): qty 4

## 2016-06-22 MED ORDER — ZOLPIDEM TARTRATE 5 MG PO TABS
5.0000 mg | ORAL_TABLET | Freq: Every evening | ORAL | Status: DC | PRN
  Administered 2016-06-23 – 2016-06-24 (×2): 5 mg via ORAL
  Filled 2016-06-22 (×2): qty 1

## 2016-06-22 NOTE — Progress Notes (Signed)
Awaiting approval from Infectious Disease for PICC insertion.  Carolee Rota, RN VAST

## 2016-06-22 NOTE — Progress Notes (Signed)
[facility] hospital infusion Coordinator will follow Mr. Alexopoulos with ID team  while an inpatient to support infusion pharmacy for IV ABX at DC as ordered.  If patient discharges after hours, please call [phone].   Larry Sierras 06/22/2016, 1:57 PM

## 2016-06-22 NOTE — Progress Notes (Signed)
INFECTIOUS DISEASE PROGRESS NOTE  ID: Kurt Young, Kurt Young is a 71 y.o. male with  Active Problems:   Infection of shoulder ([place])  Subjective: Without complaints.   Abtx:  Anti-infectives    Start     Dose/Rate Route Frequency Ordered Stop   06/22/16 2200  cefTRIAXone (ROCEPHIN) 2 g in dextrose 5 % 50 mL IVPB     2 g 100 mL/hr over 30 Minutes Intravenous Every 24 hours 06/22/16 1318     06/22/16 1000  vancomycin (VANCOCIN) 1,500 mg in sodium chloride 0.9 % 500 mL IVPB     1,500 mg 250 mL/hr over 120 Minutes Intravenous Every 12 hours 06/21/16 1933     06/22/16 0600  ceFAZolin (ANCEF) 3 g in dextrose 5 % 50 mL IVPB     3 g 130 mL/hr over 30 Minutes Intravenous On call to O.R. 06/21/16 1344 06/21/16 1605   06/21/16 2200  ceFAZolin (ANCEF) IVPB 2g/100 mL premix     2 g 200 mL/hr over 30 Minutes Intravenous Every 6 hours 06/21/16 2158 06/22/16 1559   06/21/16 1945  vancomycin (VANCOCIN) IVPB 750 mg/150 ml premix     750 mg 150 mL/hr over 60 Minutes Intravenous  Once 06/21/16 1933 06/21/16 2249   06/21/16 1614  vancomycin (VANCOCIN) 1-5 GM/200ML-% IVPB    Comments:  Laurita Quint   : cabinet override      06/21/16 1614 06/22/16 0429      Medications:  Scheduled: . docusate sodium  100 mg Oral BID  . ibuprofen  800 mg Oral TID  . metoprolol tartrate  25 mg Oral BID    Objective: Vital signs in last 24 hours: Temp:  [97.3 F (36.3 C)-98.6 F (37 C)] 98.2 F (36.8 C) (04/26 0432) Pulse Rate:  [54-82] 55 (04/26 0957) Resp:  [10-21] 19 (04/26 0432) BP: (112-158)/(56-90) 112/64 (04/26 0957) SpO2:  [96 %-100 %] 97 % (04/26 0432) Weight:  [92.1 kg (203 lb)] 92.1 kg (203 lb) (04/25 1357)   General appearance: alert, cooperative and no distress Extremities: R shoulder dressed.   Lab Results  Recent Labs  06/21/16 1400 06/22/16 0735  WBC 6.4 8.4  HGB 11.7* 11.1*  HCT 34.4* 32.1*  NA 133*  --   K 3.9  --   CL 99*  --   CO2 24  --   BUN 18  --   CREATININE 0.80   --    Liver Panel No results for input(s): PROT, ALBUMIN, AST, ALT, ALKPHOS, BILITOT, BILIDIR, IBILI in the last 72 hours. Sedimentation Rate No results for input(s): ESRSEDRATE in the last 72 hours. C-Reactive Protein No results for input(s): CRP in the last 72 hours.  Microbiology: Recent Results (from the past 240 hour(s))  Surgical pcr screen     Status: None   Collection Time: 06/21/16  1:57 PM  Result Value Ref Range Status   MRSA, PCR NEGATIVE NEGATIVE Final   Staphylococcus aureus NEGATIVE NEGATIVE Final    Comment:        The Xpert SA Assay (FDA approved for NASAL specimens in patients over 84 years of age), is one component of a comprehensive surveillance program.  Test performance has been validated by [facility] for patients greater than or equal to 60 year old. It is not intended to diagnose infection nor to guide or monitor treatment.   Aerobic/Anaerobic Culture (surgical/deep wound)     Status: None (Preliminary result)   Collection Time: 06/21/16  4:09 PM  Result  Value Ref Range Status   Specimen Description ABSCESS RIGHT SHOULDER  Final   Special Requests NONE  Final   Gram Stain   Final    ABUNDANT WBC PRESENT,BOTH PMN AND MONONUCLEAR NO ORGANISMS SEEN    Culture PENDING  Incomplete   Report Status PENDING  Incomplete    Studies/Results: No results found.   Assessment/Plan: Septic arthritis  S/p Rotator cuff repair  Total days of antibiotics: 1 vanco/ceftriaxone  Await Cx.  Place PIC Pain well controlled          Bobby Rumpf Infectious Diseases (pager) [phone] [email or web address] 06/22/2016, 1:27 PM  LOS: 1 day

## 2016-06-22 NOTE — Progress Notes (Signed)
PATIENT ID: Kurt Young, DDS  MRN: [id number]  DOB/AGE:  05/19/1945 / 71 y.o.  1 Day Post-Op Procedure(s) (LRB): IRRIGATION AND DEBRIDEMENT SHOULDER (Right)    PROGRESS NOTE Subjective:   Patient is alert, oriented, no Nausea, no Vomiting, yes passing gas, no Bowel Movement. Taking PO well. Denies SOB, Chest or Calf Pain. Using Incentive Spirometer, PAS in place. Ambulate WBAT , Patient reports pain as minimal    Objective: Vital signs in last 24 hours: Temp:  [97.3 F (36.3 C)-98.2 F (36.8 C)] 98.2 F (36.8 C) (04/26 1428) Pulse Rate:  [55-82] 62 (04/26 1428) Resp:  [10-21] 18 (04/26 1428) BP: (112-158)/(56-90) 126/60 (04/26 1428) SpO2:  [96 %-100 %] 99 % (04/26 1428)    Intake/Output from previous day: I/O last 3 completed shifts: In: 1222 [P.O.:222; I.V.:1000] Out: 150 [Drains:50; Blood:100]   Intake/Output this shift: Total I/O In: 771.7 [P.O.:240; I.V.:331.7; IV Piggyback:200] Out: -    LABORATORY DATA:  Recent Labs  06/21/16 1400 06/22/16 0735  WBC 6.4 8.4  HGB 11.7* 11.1*  HCT 34.4* 32.1*  PLT 410* 392  NA 133*  --   K 3.9  --   CL 99*  --   CO2 24  --   BUN 18  --   CREATININE 0.80  --   GLUCOSE 95  --   CALCIUM 9.5  --     Examination: Neurologically intact Neurovascular intact Sensation intact distally Intact pulses distally Dorsiflexion/Plantar flexion intact Incision: scant drainage} Drain pulled as there was no more output since AM.  Assessment:   1 Day Post-Op Procedure(s) (LRB): IRRIGATION AND DEBRIDEMENT SHOULDER (Right) ADDITIONAL DIAGNOSIS:    Plan: Pt changed to Ibuprofen 800 mg every 8 hrs at his request Awaiting Abx cultures.  DVT Prophylaxis:  Foot Pumps  DISCHARGE PLAN: Home  DISCHARGE NEEDS: IV Antibiotics     Gaetano Romberger R 06/22/2016, 3:12 PM

## 2016-06-22 NOTE — Progress Notes (Signed)
Subjective: 1 Day Post-Op Procedure(s) (LRB): IRRIGATION AND DEBRIDEMENT SHOULDER (Right) Patient reports pain as 0 on 0-10 scale.  Interscalene block is still working well.  Objective: Vital signs in last 24 hours: Temp:  [97.3 F (36.3 C)-98.6 F (37 C)] 98.2 F (36.8 C) (04/26 0432) Pulse Rate:  [54-82] 55 (04/26 0432) Resp:  [10-21] 19 (04/26 0432) BP: (119-158)/(56-90) 119/61 (04/26 0432) SpO2:  [96 %-100 %] 97 % (04/26 0432) Weight:  [92.1 kg (203 lb)] 92.1 kg (203 lb) (04/25 1357)  Intake/Output from previous day: 04/25 0701 - 04/26 0700 In: 1222 [P.O.:222; I.V.:1000] Out: 150 [Drains:50; Blood:100] Intake/Output this shift: No intake/output data recorded.   Recent Labs  06/21/16 1400  HGB 11.7*    Recent Labs  06/21/16 1400  WBC 6.4  RBC 3.88*  HCT 34.4*  PLT 410*    Recent Labs  06/21/16 1400  NA 133*  K 3.9  CL 99*  CO2 24  BUN 18  CREATININE 0.80  GLUCOSE 95  CALCIUM 9.5   No results for input(s): LABPT, INR in the last 72 hours.  Neurologically intact ABD soft Neurovascular intact Sensation intact distally Intact pulses distally Dorsiflexion/Plantar flexion intact Incision: scant drainage No cellulitis present Compartment soft Dressing is removed.  Wound had little if any drainage.  The skin itself is much less angry and has a normal appearance.  Drains are in good position.  He has put out a total of 50 cc since surgery.  There is separation of plasma and blood in the drainage tube.  Gram stain and Cultures taken in my office Yesterday, showed to numerous to count polys, no organisms, cultures are pending. Assessment/Plan: 1 Day Post-Op Procedure(s) (LRB): IRRIGATION AND DEBRIDEMENT SHOULDER (Right) Continue antibiotics per infectious diseases pending results of the cultures.  The patient will probably have a PICC line inserted.  These findings were discussed with the patient.  Rehan Holness J 06/22/2016, 7:45 AM

## 2016-06-23 MED ORDER — VANCOMYCIN HCL 10 G IV SOLR: 1500.0000 mg | Freq: Two times a day (BID) | INTRAVENOUS | 1 refills | Status: AC

## 2016-06-23 MED ORDER — DEXTROSE 5 % IV SOLN: 2.0000 g | INTRAVENOUS | 1 refills | Status: DC

## 2016-06-23 MED ORDER — SODIUM CHLORIDE 0.9% FLUSH
10.0000 mL | INTRAVENOUS | Status: DC | PRN
  Administered 2016-06-23: 10 mL
  Filled 2016-06-23: qty 40

## 2016-06-23 NOTE — Care Management Note (Signed)
Case Management Note  Patient Details  Name: NESBIT MICHON, DDS MRN: [id number] Date of Birth: 02-26-1946  Subjective/Objective:    71 yr old gentleman admitted s/p Right shoulder open irrigation and debridement with removal of suture and suture anchor material debridement of necrotic supraspinatus tendon debridement of osteomyelitis around anchor tracks.  Patient had right shoulder open rotator cuff tear 5 weeks ago.            Action/Plan: Case manager spoke with patient concerning discharge plan. Patient and Dr. are waiting to hear culture results. Patient may be getting PICC line to discharge home on IV antibiotics. CM offered patient choice for Home Health agency and explained that should he need antibiotics they will be arranged by [place]. Referral for Home Health RN was called to [place], [place], Pam Chandler, RN IV antibiotic specialist is following patient.    Expected Discharge Date:   pending               Expected Discharge Plan:  [place]  In-House Referral:     Discharge planning Services  CM Consult  Post Acute Care Choice:  Home Health Choice offered to:  Patient  DME Arranged:  IV pump/equipment DME Agency:  [place]:  RN [facility] Agency:  [place]  Status of Service:  In process, will continue to follow  If discussed at Long Length of Stay Meetings, dates discussed:    Additional Comments:  Ninfa Meeker, RN 06/23/2016, 2:21 PM

## 2016-06-23 NOTE — Progress Notes (Addendum)
Received a call from Joanell Rising- PA, letting me know that he spoke to Dr. Johnnye Sima (ID) and he is ok with the PICC line inserted to the patient. Plan is to go home with IV antibiotics.

## 2016-06-23 NOTE — Progress Notes (Signed)
Called the answering service around 1700H to notify the on call doctor that the patient won't be able to go home today because per M[place]ela [place] who is at the bedside, they will not be able to start the antibiotics at home tonight because they will still need to get supplies. Left a message to answering service.

## 2016-06-23 NOTE — Progress Notes (Signed)
PHARMACY CONSULT NOTE FOR:  OUTPATIENT  PARENTERAL ANTIBIOTIC THERAPY (OPAT)  Indication: Bactermia Regimen: Ceftriaxone 2g IV q24h and Vancomycin 1500 mg IV q12h  End date: 08/02/16  IV antibiotic discharge orders are pended.  --Vancomycin trough due to be checked tomorrow 06/24/16 @ 09:30 -- Wait to sign the antibiotic discharge order until after 4/28 Saturday's 09:30 AM Vancomycin trough has been evaluated by [facility] inpatient pharmacist as vancomycin outpatient dose may need to be adjusted base on the trough result. Call the pharmacist at [phone] on 4/28 AM to discuss. Once verified that vancomycin dose is appropriate or has been adjusted, please sign in New Orders before discharge.  To discharging provider:  please sign these orders via discharge navigator,  Select New Orders & click on the button choice - Manage This Unsigned Work.   Thank you for allowing pharmacy to be a part of this patient's care.  Nicole Cella, RPh Clinical Pharmacist Pager: [phone] On 4/28 7am-3:30pm #[phone] Or main pharmacy [phone] 06/23/2016, 5:29 PM

## 2016-06-23 NOTE — Progress Notes (Addendum)
PATIENT ID: Kurt Young, DDS  MRN: [id number]  DOB/AGE:  Jul 20, 1945 / 71 y.o.  2 Days Post-Op Procedure(s) (LRB): IRRIGATION AND DEBRIDEMENT SHOULDER (Right)    PROGRESS NOTE Subjective:   Patient is alert, oriented, no Nausea, no Vomiting, yes passing gas, yes Bowel Movement. Taking PO well. Denies SOB, Chest or Calf Pain. Using Incentive Spirometer, PAS in place. Ambulate WBAT, Patient reports pain as mild,     Objective: Vital signs in last 24 hours: Temp:  [97.5 F (36.4 C)-98.2 F (36.8 C)] 97.8 F (36.6 C) (04/27 5643) Pulse Rate:  [51-62] 51 (04/27 0608) Resp:  [16-18] 16 (04/27 0608) BP: (112-129)/(60-76) 129/76 (04/27 0608) SpO2:  [99 %] 99 % (04/27 0608)    Intake/Output from previous day: I/O last 3 completed shifts: In: 3215.7 [P.O.:1584; I.V.:331.7; IV Piggyback:1300] Out: 50 [Drains:50]   Intake/Output this shift: No intake/output data recorded.   LABORATORY DATA:  Recent Labs  06/21/16 1400 06/22/16 0735  WBC 6.4 8.4  HGB 11.7* 11.1*  HCT 34.4* 32.1*  PLT 410* 392  NA 133*  --   K 3.9  --   CL 99*  --   CO2 24  --   BUN 18  --   CREATININE 0.80  --   GLUCOSE 95  --   CALCIUM 9.5  --     Examination: Neurologically intact Neurovascular intact Sensation intact distally Intact pulses distally Dorsiflexion/Plantar flexion intact Incision: dressing C/D/I and scant drainage Compartment soft} Dressing changed with minimal drainage noted  Assessment:   2 Days Post-Op Procedure(s) (LRB): IRRIGATION AND DEBRIDEMENT SHOULDER (Right) ADDITIONAL DIAGNOSIS:    Plan:  Continue cradle sling for right arm. Awaiting cultures from [facility] and hospital.  No growth as of yet.  Continue with ABX as recommended by infectious disease.    Talked with Dr. Johnnye Sima with ID.  Plan is to proceed with PICC line that he will need for IV antibiotics at home.  Plan is to continue Vanc and Ceftriaxone for 6 weeks  DVT Prophylaxis:  Foot Pumps  DISCHARGE  PLAN: Home once PICC line placed  DISCHARGE NEEDS: IV Antibiotics    Kurt Young R 06/23/2016, 8:30 AM

## 2016-06-23 NOTE — Progress Notes (Signed)
Peripherally Inserted Central Catheter/Midline Placement  The IV Nurse has discussed with the patient and/or persons authorized to consent for the patient, the purpose of this procedure and the potential benefits and risks involved with this procedure.  The benefits include less needle sticks, lab draws from the catheter, and the patient may be discharged home with the catheter. Risks include, but not limited to, infection, bleeding, blood clot (thrombus formation), and puncture of an artery; nerve damage and irregular heartbeat and possibility to perform a PICC exchange if needed/ordered by physician.  Alternatives to this procedure were also discussed.  Bard Power PICC patient education guide, fact sheet on infection prevention and patient information card has been provided to patient /or left at bedside.    PICC/Midline Placement Documentation  PICC Single Lumen 02/33/43 PICC Left Basilic 51 cm 0 cm (Active)  Indication for Insertion or Continuance of Line Home intravenous therapies (PICC only) 06/23/2016  5:00 PM  Exposed Catheter (cm) 0 cm 06/23/2016  5:00 PM  Dressing Change Due 06/30/16 06/23/2016  5:00 PM       Jule Economy Horton 06/23/2016, 5:39 PM

## 2016-06-23 NOTE — Discharge Summary (Signed)
Patient ID: [name], Kurt Young MRN: [id number] DOB/AGE: [birth year] 71 y.o.  Admit date: 06/21/2016 Discharge date: 06/23/2016  Admission Diagnoses:  Active Problems:   Infection of shoulder [facility])   Discharge Diagnoses:  Same  Past Medical History:  Diagnosis Date  . Atrial flutter with rapid ventricular response ([place])   . Cervical osteoarthritis 12/2015   Dr. Noemi Young got an MRI C spine 12/2015: signif C3-4, C4-5, and C5-6 bulging discs with foraminal stenosis bilat at C5-6--improved on prednisone.  . Complication of anesthesia   . DJD (degenerative joint disease)   . Dysrhythmia    a-flutter with RVR  . PONV (postoperative nausea and vomiting)   . Right inguinal hernia 08/2015   Plan for surgical repair as of 10/12/15 eval by Dr. Dalbert Young.  . Shoulder pain, bilateral 12/2015   possible acute traumatic poss RC tear: Dr. Noemi Young to do R shoulder MRI as of 01/17/16.  . Skin cancer    melanoma X 2; squamous cell X 1  . Urethral stricture 2010   Dr Kurt Young    Surgeries: Procedure(s): IRRIGATION AND DEBRIDEMENT SHOULDER on 06/21/2016   Consultants:   Discharged Condition: Improved  Hospital Course: Kurt Young, Kurt Young is an 71 y.o. male who was admitted 06/21/2016 for operative treatment of<principal problem not specified>. Patient has severe unremitting pain that affects sleep, daily activities, and work/hobbies. After pre-op clearance the patient was taken to the operating room on 06/21/2016 and underwent  Procedure(s): IRRIGATION AND DEBRIDEMENT SHOULDER.    Patient was given perioperative antibiotics: Anti-infectives    Start     Dose/Rate Route Frequency Ordered Stop   06/23/16 0000  cefTRIAXone 2 g in dextrose 5 % 50 mL     2 g 100 mL/hr over 30 Minutes Intravenous Every 24 hours 06/23/16 1541     06/23/16 0000  vancomycin 1,500 mg in sodium chloride 0.9 % 500 mL     1,500 mg 250 mL/hr over 120 Minutes Intravenous Every 12 hours 06/23/16 1541     06/22/16 2200   cefTRIAXone (ROCEPHIN) 2 g in dextrose 5 % 50 mL IVPB     2 g 100 mL/hr over 30 Minutes Intravenous Every 24 hours 06/22/16 1318     06/22/16 1000  vancomycin (VANCOCIN) 1,500 mg in sodium chloride 0.9 % 500 mL IVPB     1,500 mg 250 mL/hr over 120 Minutes Intravenous Every 12 hours 06/21/16 1933     06/22/16 0600  ceFAZolin (ANCEF) 3 g in dextrose 5 % 50 mL IVPB     3 g 130 mL/hr over 30 Minutes Intravenous On call to O.R. 06/21/16 1344 06/21/16 1605   06/21/16 2200  ceFAZolin (ANCEF) IVPB 2g/100 mL premix     2 g 200 mL/hr over 30 Minutes Intravenous Every 6 hours 06/21/16 2158 06/22/16 1330   06/21/16 1945  vancomycin (VANCOCIN) IVPB 750 mg/150 ml premix     750 mg 150 mL/hr over 60 Minutes Intravenous  Once 06/21/16 1933 06/21/16 2249   06/21/16 1614  vancomycin (VANCOCIN) 1-5 GM/200ML-% IVPB    Comments:  Laurita Quint   : cabinet override      06/21/16 1614 06/22/16 0429       Patient was given sequential compression devices, early ambulation, and chemoprophylaxis to prevent DVT.  Patient benefited maximally from hospital stay and there were no complications.    Recent vital signs: Patient Vitals for the past 24 hrs:  BP Temp Temp src Pulse Resp SpO2  06/23/16 1443 113/62 97.6  F (36.4 C) Oral (!) 57 16 98 %  06/23/16 0920 116/68 98.3 F (36.8 C) Oral 63 16 98 %  06/23/16 0608 129/76 97.8 F (36.6 C) Oral (!) 51 16 99 %  06/22/16 2126 127/66 97.5 F (36.4 C) Oral (!) 58 16 99 %     Recent laboratory studies:  Recent Labs  06/21/16 1400 06/22/16 0735  WBC 6.4 8.4  HGB 11.7* 11.1*  HCT 34.4* 32.1*  PLT 410* 392  NA 133*  --   K 3.9  --   CL 99*  --   CO2 24  --   BUN 18  --   CREATININE 0.80  --   GLUCOSE 95  --   CALCIUM 9.5  --      Discharge Medications:   Allergies as of 06/23/2016      Reactions   Codeine Other (See Comments)   REACTION: angioedema   Morphine And Related Hives      Medication List    STOP taking these medications   cephALEXin  500 MG capsule Commonly known as:  KEFLEX     TAKE these medications   Astaxanthin 4 MG Caps Take 4 mg by mouth daily.   cefTRIAXone 2 g in dextrose 5 % 50 mL Inject 2 g into the vein daily.   Celery Seed Oil Take 1 capsule by mouth daily.   cholecalciferol 1000 units tablet Commonly known as:  VITAMIN D Take 1,000 Units by mouth daily.   Co-Enzyme Q-10 30 MG Caps Take 30 mg by mouth daily.   Digestive Enzymes Tabs Take 1 tablet by mouth daily.   Fish Oil 1000 MG Caps Take 1,000 mg by mouth daily.   HYDROcodone-acetaminophen 5-325 MG tablet Commonly known as:  NORCO/VICODIN Take 1-2 tablets by mouth every 4 (four) hours as needed for moderate pain or severe pain.   IRON PO Take 1 tablet by mouth daily.   MAGNESIUM PO Take 1 tablet by mouth daily.   meloxicam 7.5 MG tablet Commonly known as:  MOBIC Take 7.5 mg by mouth daily as needed for pain.   metoprolol tartrate 25 MG tablet Commonly known as:  LOPRESSOR Take 25 mg by mouth 2 (two) times daily.   multivitamin with minerals Tabs tablet Take 1 tablet by mouth daily.   OVER THE COUNTER MEDICATION Place 1 drop into both eyes 2 (two) times daily. Over the counter lubricating eye drop   PROSTATE HEALTH PO Take 1 tablet by mouth daily.   TURMERIC PO Take 2 capsules by mouth daily.   vancomycin 1,500 mg in sodium chloride 0.9 % 500 mL Inject 1,500 mg into the vein every 12 (twelve) hours.   VITAMIN B-12 PO Take 1 tablet by mouth daily.   vitamin C 1000 MG tablet Take 1,000 mg by mouth 2 (two) times daily.   vitamin E 400 UNIT capsule Generic drug:  vitamin E Take 400 Units by mouth daily.       Diagnostic Studies: No results found.  Disposition: 01-Home or Self Care  Discharge Instructions    Call MD / Call 911    Complete by:  As directed    If you experience chest pain or shortness of breath, CALL 911 and be transported to the hospital emergency room.  If you develope a fever above 101 F,  pus (white drainage) or increased drainage or redness at the wound, or calf pain, call your surgeon's office.   Change dressing    Complete by:  As directed    Change dressing on day 3, then change the dressing daily with sterile 4 x 4 inch gauze dressing.   Constipation Prevention    Complete by:  As directed    Drink plenty of fluids.  Prune juice may be helpful.  You may use a stool softener, such as Colace (over the counter) 100 mg twice a day.  Use MiraLax (over the counter) for constipation as needed.   Diet - low sodium heart healthy    Complete by:  As directed    Driving restrictions    Complete by:  As directed    No driving for 2 weeks   Increase activity slowly as tolerated    Complete by:  As directed    Patient may shower    Complete by:  As directed    You may shower without a dressing once there is no drainage.  Do not wash over the wound.  If drainage remains, cover wound with plastic wrap and then shower.      Follow-up Information    [facility] Follow up.   Contact information: [place] [address] [phone]        Kerin Salen, MD Follow up in 5 day(s).   Specialty:  Orthopedic Surgery Contact information: [place] [address] [phone]            Signed: Theodosia Quay 06/23/2016, 3:43 PM

## 2016-06-23 NOTE — Progress Notes (Addendum)
INFECTIOUS DISEASE PROGRESS NOTE  ID: Kurt Young, DDS is a 71 y.o. male with  Active Problems:   Infection of shoulder ([place])  Subjective: Without complaints.  Feels like he can move R shoulder.   Abtx:  Anti-infectives    Start     Dose/Rate Route Frequency Ordered Stop   06/23/16 0000  cefTRIAXone 2 g in dextrose 5 % 50 mL     2 g 100 mL/hr over 30 Minutes Intravenous Every 24 hours 06/23/16 1541     06/23/16 0000  vancomycin 1,500 mg in sodium chloride 0.9 % 500 mL     1,500 mg 250 mL/hr over 120 Minutes Intravenous Every 12 hours 06/23/16 1541     06/22/16 2200  cefTRIAXone (ROCEPHIN) 2 g in dextrose 5 % 50 mL IVPB     2 g 100 mL/hr over 30 Minutes Intravenous Every 24 hours 06/22/16 1318     06/22/16 1000  vancomycin (VANCOCIN) 1,500 mg in sodium chloride 0.9 % 500 mL IVPB     1,500 mg 250 mL/hr over 120 Minutes Intravenous Every 12 hours 06/21/16 1933     06/22/16 0600  ceFAZolin (ANCEF) 3 g in dextrose 5 % 50 mL IVPB     3 g 130 mL/hr over 30 Minutes Intravenous On call to O.R. 06/21/16 1344 06/21/16 1605   06/21/16 2200  ceFAZolin (ANCEF) IVPB 2g/100 mL premix     2 g 200 mL/hr over 30 Minutes Intravenous Every 6 hours 06/21/16 2158 06/22/16 1330   06/21/16 1945  vancomycin (VANCOCIN) IVPB 750 mg/150 ml premix     750 mg 150 mL/hr over 60 Minutes Intravenous  Once 06/21/16 1933 06/21/16 2249   06/21/16 1614  vancomycin (VANCOCIN) 1-5 GM/200ML-% IVPB    Comments:  Laurita Quint   : cabinet override      06/21/16 1614 06/22/16 0429      Medications:  Scheduled: . docusate sodium  100 mg Oral BID  . ibuprofen  800 mg Oral TID  . metoprolol tartrate  25 mg Oral BID    Objective: Vital signs in last 24 hours: Temp:  [97.5 F (36.4 C)-98.3 F (36.8 C)] 97.6 F (36.4 C) (04/27 1443) Pulse Rate:  [51-63] 57 (04/27 1443) Resp:  [16] 16 (04/27 1443) BP: (113-129)/(62-76) 113/62 (04/27 1443) SpO2:  [98 %-99 %] 98 % (04/27 1443)   General appearance:  alert, cooperative and no distress Incision/Wound: covered  Lab Results  Recent Labs  06/21/16 1400 06/22/16 0735  WBC 6.4 8.4  HGB 11.7* 11.1*  HCT 34.4* 32.1*  NA 133*  --   K 3.9  --   CL 99*  --   CO2 24  --   BUN 18  --   CREATININE 0.80  --    Liver Panel No results for input(s): PROT, ALBUMIN, AST, ALT, ALKPHOS, BILITOT, BILIDIR, IBILI in the last 72 hours. Sedimentation Rate No results for input(s): ESRSEDRATE in the last 72 hours. C-Reactive Protein No results for input(s): CRP in the last 72 hours.  Microbiology: Recent Results (from the past 240 hour(s))  Surgical pcr screen     Status: None   Collection Time: 06/21/16  1:57 PM  Result Value Ref Range Status   MRSA, PCR NEGATIVE NEGATIVE Final   Staphylococcus aureus NEGATIVE NEGATIVE Final    Comment:        The Xpert SA Assay (FDA approved for NASAL specimens in patients over 36 years of age), is one component of  a comprehensive surveillance program.  Test performance has been validated by [facility] for patients greater than or equal to 56 year old. It is not intended to diagnose infection nor to guide or monitor treatment.   Aerobic/Anaerobic Culture (surgical/deep wound)     Status: None (Preliminary result)   Collection Time: 06/21/16  4:09 PM  Result Value Ref Range Status   Specimen Description ABSCESS RIGHT SHOULDER  Final   Special Requests NONE  Final   Gram Stain   Final    ABUNDANT WBC PRESENT,BOTH PMN AND MONONUCLEAR NO ORGANISMS SEEN    Culture NO GROWTH 2 DAYS  Final   Report Status PENDING  Incomplete    Studies/Results: No results found.   Assessment/Plan: Septic arthritis  S/p Rotator cuff repair  Total days of antibiotics: 2 vanco/ceftriaxone  Getting [facility] Will ask for opat consult for home anbx Will plan to continue his vanco/ceftriaxone for 6 weeks Will f/u his Cx.  He defers pnvx and shingles vax to outpt and he has both shoulders working.  Will have him seen  in ID in 4 weeks with me  Culture Result: ngtd  Allergies  Allergen Reactions  . Codeine Other (See Comments)    REACTION: angioedema  . Morphine And Related Hives    Discharge antibiotics: ceftraixone 2g ivpb qday Per pharmacy protocol vancomycin Aim for Vancomycin trough 15-20 (unless otherwise indicated) Duration: 40 days End Date: August 02, 2016  [place] Per Protocol:  Labs weekly while on IV antibiotics: _x_ CBC with differential __ BMP _x_ CMP _x_ CRP _x_ ESR _x_ Vancomycin trough  _x_ Please pull PIC at completion of IV antibiotics __ Please leave PIC in place until doctor has seen patient or been notified  Fax weekly labs to [phone]  Clinic Follow Up Appt: Stela [place] 4 weeks          Bobby Rumpf Infectious Diseases (pager) [phone] [email or web address] 06/23/2016, 4:42 PM  LOS: 2 days

## 2016-06-24 LAB — BASIC METABOLIC PANEL WITH GFR
Anion gap: 9 (ref 5–15)
BUN: 14 mg/dL (ref 6–20)
CO2: 26 mmol/L (ref 22–32)
Calcium: 9 mg/dL (ref 8.9–10.3)
Chloride: 101 mmol/L (ref 101–111)
Creatinine, Ser: 0.74 mg/dL (ref 0.61–1.24)
GFR calc Af Amer: >60 mL/min
GFR calc non Af Amer: >60 mL/min
Glucose, Bld: 92 mg/dL (ref 65–99)
Potassium: 4.3 mmol/L (ref 3.5–5.1)
Sodium: 136 mmol/L (ref 135–145)

## 2016-06-24 LAB — VANCOMYCIN, TROUGH: VANCOMYCIN TR: 16 ug/mL (ref 15–20)

## 2016-06-24 MED ORDER — HEPARIN SOD (PORK) LOCK FLUSH 100 UNIT/ML IV SOLN
250.0000 [IU] | INTRAVENOUS | Status: AC | PRN
  Administered 2016-06-24: 250 [IU]

## 2016-06-24 MED ORDER — CEFTRIAXONE IV (FOR PTA / DISCHARGE USE ONLY): 2.0000 g | INTRAVENOUS | 0 refills | Status: AC

## 2016-06-24 MED ORDER — VANCOMYCIN IV (FOR PTA / DISCHARGE USE ONLY): 1500.0000 mg | Freq: Two times a day (BID) | INTRAVENOUS | 0 refills | Status: DC

## 2016-06-24 NOTE — Progress Notes (Signed)
Discharging patient with PICC line for 6 weeks of antibiotics.

## 2016-06-24 NOTE — Progress Notes (Signed)
Removed bulky dressing from shoulder incision. Incision without drainage, applied island dressing to right shoulder.Gave two mepilex dressings to take home.

## 2016-06-24 NOTE — Progress Notes (Signed)
IV team flushing PICC line now, before discharging home. Written and verbal discharge instructions given to patient, verbalized understanding. Prescriptions given for new medications. Left unit via wheelchair, accompanied by family member and unit Company secretary. Right arm in sling. Denies pain or other distress.

## 2016-06-24 NOTE — Progress Notes (Signed)
   PATIENT ID: Kurt Young, DDS   3 Days Post-Op Procedure(s) (LRB): IRRIGATION AND DEBRIDEMENT SHOULDER (Right)  Subjective: Doing great this morning. No pain in right shoulder. Ready to go home.   Objective:  Vitals:   06/23/16 1931 06/24/16 0504  BP: (!) 149/60 (!) 167/72  Pulse: 62 (!) 52  Resp: 16 16  Temp: 98.3 F (36.8 C) 97.5 F (36.4 C)     R UE dressing c/d/I No surrounding erythema, warmth sling  Labs:   Recent Labs  06/21/16 1400 06/22/16 0735  HGB 11.7* 11.1*   Recent Labs  06/21/16 1400 06/22/16 0735  WBC 6.4 8.4  RBC 3.88* 3.68*  HCT 34.4* 32.1*  PLT 410* 392   Recent Labs  06/21/16 1400  NA 133*  K 3.9  CL 99*  CO2 24  BUN 18  CREATININE 0.80  GLUCOSE 95  CALCIUM 9.5    Assessment and Plan: 3 day s/p right shoulder I and D Per ID d/c with ceftriaxone 2g IVPD q day for 40 days PIC line placed yesterday Advanced care to manage IV care/administration at home D/c home today  VTE proph: scds

## 2016-06-24 NOTE — Progress Notes (Signed)
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  Patient has a history of rotator cuff repair on 05/19/16, then started to have pain and swelling.  Continues on abx for right shoulder infxn/septic shoulder. Hx rotator cuff repair 3/23. S/p I&D 4/25. VT today is therapeutic at 28. Afebrile, WBC wnl. SCr stable, CrCl > 151ml/min  Plan: Continue vancomycin 1.5g IV Q12h Continue ceftriaxone 2g IV Q24h Monitor clinical picture, renal function, VT prn Last day of therapy is 08/02/16 OPAT orders pended (Adjust if dose changes)   Height: 6\' 7"  (200.7 cm) Weight: 203 lb (92.1 kg) IBW/kg (Calculated) : 93.7  Temp (24hrs), Avg:97.8 F (36.6 C), Min:97.5 F (36.4 C), Max:98.3 F (36.8 C)   Recent Labs Lab 06/21/16 1400 06/22/16 0735 06/24/16 0545 06/24/16 0916  WBC 6.4 8.4  --   --   CREATININE 0.80  --  0.74  --   VANCOTROUGH  --   --   --  16    Estimated Creatinine Clearance: 115.1 mL/min (by C-G formula based on SCr of 0.74 mg/dL).    Allergies  Allergen Reactions  . Codeine Other (See Comments)    REACTION: angioedema  . Morphine And Related Hives   Vanc 4/25 >> Cefazolin 4/25>> 4/26 Ceftriaxone 4/27 >>  4/28 VT = 16 (Continue 1.5g IV Q12h)  4/25 Wound Cx: ngtd  Elenor Quinones, PharmD, BCPS Clinical Pharmacist Pager [phone] 06/24/2016 10:40 AM

## 2016-06-24 NOTE — Progress Notes (Signed)
Patient is receiving dose of Vancomycin IV after therapeutic trough. Patient will be discharged after Vancomycin completed.

## 2016-06-25 ENCOUNTER — Encounter: Payer: Self-pay | Admitting: Family Medicine

## 2016-06-27 ENCOUNTER — Emergency Department (HOSPITAL_COMMUNITY)
Admission: EM | Admit: 2016-06-27 | Discharge: 2016-06-27 | Disposition: A | Discharge status: Home / Self Care | Payer: Medicare Other | Attending: Emergency Medicine | Admitting: Emergency Medicine

## 2016-06-27 ENCOUNTER — Encounter (HOSPITAL_COMMUNITY): Payer: Self-pay | Admitting: Emergency Medicine

## 2016-06-27 DIAGNOSIS — Z79899 Other long term (current) drug therapy: Secondary | ICD-10-CM | POA: Diagnosis not present

## 2016-06-27 DIAGNOSIS — A0472 Enterocolitis due to Clostridium difficile, not specified as recurrent: Secondary | ICD-10-CM

## 2016-06-27 DIAGNOSIS — Z96659 Presence of unspecified artificial knee joint: Secondary | ICD-10-CM | POA: Diagnosis not present

## 2016-06-27 DIAGNOSIS — T887XXA Unspecified adverse effect of drug or medicament, initial encounter: Secondary | ICD-10-CM | POA: Insufficient documentation

## 2016-06-27 DIAGNOSIS — R6889 Other general symptoms and signs: Secondary | ICD-10-CM | POA: Insufficient documentation

## 2016-06-27 DIAGNOSIS — T368X5A Adverse effect of other systemic antibiotics, initial encounter: Secondary | ICD-10-CM | POA: Diagnosis not present

## 2016-06-27 DIAGNOSIS — Z85828 Personal history of other malignant neoplasm of skin: Secondary | ICD-10-CM | POA: Diagnosis not present

## 2016-06-27 DIAGNOSIS — Y829 Unspecified medical devices associated with adverse incidents: Secondary | ICD-10-CM | POA: Insufficient documentation

## 2016-06-27 HISTORY — DX: Enterocolitis due to Clostridium difficile, not specified as recurrent: A04.72

## 2016-06-27 NOTE — ED Notes (Signed)
2 sets of cultures drawn. First set from LEFT PICC line. Second set from right forearm.

## 2016-06-27 NOTE — ED Triage Notes (Signed)
Pt here for possible vancomycin reaction vs sepsis; pt has PICC line in left arm; pt had infected shoulder post op

## 2016-06-27 NOTE — ED Notes (Signed)
PICC line to LUA- surrounding area does not appear red or inflamed. No drainage present. Dressing intact

## 2016-06-27 NOTE — Discharge Instructions (Signed)
Try rocephin this evening.  Discuss with your ID doc if they want you to continue your vancomycin.  Return for redness at the site, rigors with rocephin injection so it can be reevaluated.

## 2016-06-27 NOTE — Anesthesia Postprocedure Evaluation (Signed)
Anesthesia Post Note  Patient: Myra Gianotti, DDS  Procedure(s) Performed: Procedure(s) (LRB): IRRIGATION AND DEBRIDEMENT SHOULDER (Right)  Patient location during evaluation: PACU Anesthesia Type: General Level of consciousness: awake and alert Pain management: pain level controlled Vital Signs Assessment: post-procedure vital signs reviewed and stable Respiratory status: spontaneous breathing, nonlabored ventilation, respiratory function stable and patient connected to nasal cannula oxygen Cardiovascular status: blood pressure returned to baseline and stable Postop Assessment: no signs of nausea or vomiting Anesthetic complications: no        Last Vitals:  Vitals:   06/24/16 0938 06/24/16 1345  BP:  (!) 152/67  Pulse: 62 63  Resp: 18 16  Temp:  36.4 C    Last Pain:  Vitals:   06/24/16 1345  TempSrc: Oral  PainSc:    Pain Goal: Patients Stated Pain Goal: 0 (06/23/16 1551)               Lyndle Herrlich EDWARD

## 2016-06-27 NOTE — ED Provider Notes (Addendum)
[facility] DEPT Provider Note   CSN: [id number] Arrival date & time: 06/27/16  1537     History   Chief Complaint Chief Complaint  Patient presents with  . 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  72 yo M with a chief complaint of severe shakes and fever. This occurs when he gets himself vancomycin at home. He unfortunately had a PICC line placed due to an infected shoulder postop. He has been on vancomycin and Rocephin. Last dose was this morning. He did not use the Rocephin. He thinks most likely caused by the vancomycin injection. Denies any redness at the site. Has had a fever as high as 100 at home. Denies cough congestion abdominal pain and vomiting. No significant tenderness to the right shoulder.   The history is provided by the patient.  Illness  This is a new problem. The current episode started yesterday. The problem occurs constantly. The problem has not changed since onset.Pertinent negatives include no chest pain, no abdominal pain, no headaches and no shortness of breath. Nothing aggravates the symptoms. Nothing relieves the symptoms. He has tried nothing for the symptoms. The treatment provided no relief.    Past Medical History:  Diagnosis Date  . Atrial flutter with rapid ventricular response ([place])   . Cervical osteoarthritis 12/2015   Dr. Noemi Chapel got an MRI C spine 12/2015: signif C3-4, C4-5, and C5-6 bulging discs with foraminal stenosis bilat at C5-6--improved on prednisone.  . Complication of anesthesia   . DJD (degenerative joint disease)   . Dysrhythmia    a-flutter with RVR  . Joint infection ([place]) 05/2016   Occured about 1 mo s/p R rotator cuff repair.  [name] PONV (postoperative nausea and vomiting)   . Shoulder pain, bilateral 12/2015   possible acute traumatic poss RC tear: Dr. Noemi Chapel to do R shoulder MRI as of 01/17/16.  . Skin cancer    melanoma X 2; squamous cell X 1  . Urethral stricture 2010   Dr Hartley Barefoot     Patient Active Problem List   Diagnosis Date Noted  . Infection of shoulder ([place]) 06/21/2016  . Atrial flutter ([place])   . Acute bronchitis 05/14/2015  . DEGENERATIVE JOINT DISEASE, ADVANCED 02/16/2009  . OTHER PREMATURE BEATS 02/10/2009  . SKIN CANCER, HX OF 11/09/2008    Past Surgical History:  Procedure Laterality Date  . APPENDECTOMY    . CARDIOVERSION N/A 07/05/2015   For atrial flutter.  Procedure: CARDIOVERSION;  Surgeon: Fay Records, MD;  Location[facility] ENDOSCOPY;  Service: Cardiovascular;  Laterality: N/A;  . COLONOSCOPY  2002   negative  . INGUINAL HERNIA REPAIR Left   . INGUINAL HERNIA REPAIR Right 11/24/2015   Procedure: OPEN RIGHT INGUINAL HERNIA REPAIR WITH MESH;  Surgeon: Fanny Skates, MD;  Location: [place];  Service: General;  Laterality: Right;  . INSERTION OF MESH Right 11/24/2015   Procedure: INSERTION OF MESH;  Surgeon: Fanny Skates, MD;  Location: [place];  Service: General;  Laterality: Right;  . IRRIGATION AND DEBRIDEMENT SHOULDER Right 06/21/2016   Procedure: IRRIGATION AND DEBRIDEMENT SHOULDER;  Surgeon: Frederik Pear, MD;  Location: [place];  Service: Orthopedics;  Laterality: Right;  . JOINT REPLACEMENT Left   . MOHS SURGERY     X 2 of back  . ROTATOR CUFF REPAIR Right 05/19/2016  . TEE WITHOUT CARDIOVERSION N/A 07/05/2015   PFO noted.  Procedure: TRANSESOPHAGEAL ECHOCARDIOGRAM (TEE);  Surgeon: Fay Records,  MD;  Location: [facility] ENDOSCOPY;  Service: Cardiovascular;  Laterality: N/A;  . tonsillectomy    . TOTAL KNEE ARTHROPLASTY  2010   Dr Noemi Chapel       Home Medications    Prior to Admission medications   Medication Sig Start Date End Date Taking? Authorizing Provider  Ascorbic Acid (VITAMIN C) 1000 MG tablet Take 1,000 mg by mouth 2 (two) times daily.   Yes Historical Provider, MD  Astaxanthin 4 MG CAPS Take 4 mg by mouth daily.   Yes Historical Provider, MD  cefTRIAXone (ROCEPHIN) IVPB Inject 2 g into the vein daily.  Indication:  Shoulder infection Last Day of Therapy:  6/62018 Labs - Once weekly:  CBC/D and BMP, Labs - Every other week:  ESR and CRP Patient taking differently: Inject 2 g into the vein daily. Indication:  Shoulder infection Last Day of Therapy:  08/02/2016 Labs - Once weekly:  CBC/D and BMP, Labs - Every other week:  ESR and CRP 06/24/16 08/02/16 Yes Danielle Laliberte, PA-C  cefTRIAXone 2 g in dextrose 5 % 50 mL Inject 2 g into the vein daily. 06/23/16   Leighton Parody, PA-C  Celery Seed OIL Take 1 capsule by mouth daily.    Historical Provider, MD  cholecalciferol (VITAMIN D) 1000 units tablet Take 1,000 Units by mouth daily.    Historical Provider, MD  Co-Enzyme Q-10 30 MG CAPS Take 30 mg by mouth daily.    Historical Provider, MD  Cyanocobalamin (VITAMIN B-12 PO) Take 1 tablet by mouth daily.    Historical Provider, MD  Digestive Enzymes TABS Take 1 tablet by mouth daily.    Historical Provider, MD  HYDROcodone-acetaminophen (NORCO/VICODIN) 5-325 MG tablet Take 1-2 tablets by mouth every 4 (four) hours as needed for moderate pain or severe pain. Patient not taking: Reported on 06/21/2016 11/24/15   Fanny Skates, MD  IRON PO Take 1 tablet by mouth daily.    Historical Provider, MD  MAGNESIUM PO Take 1 tablet by mouth daily.    Historical Provider, MD  meloxicam (MOBIC) 7.5 MG tablet Take 7.5 mg by mouth daily as needed for pain. 04/05/16   Historical Provider, MD  metoprolol tartrate (LOPRESSOR) 25 MG tablet Take 25 mg by mouth 2 (two) times daily.    Historical Provider, MD  Misc Natural Products (PROSTATE HEALTH PO) Take 1 tablet by mouth daily.    Historical Provider, MD  Multiple Vitamin (MULTIVITAMIN WITH MINERALS) TABS tablet Take 1 tablet by mouth daily.    Historical Provider, MD  Omega-3 Fatty Acids (FISH OIL) 1000 MG CAPS Take 1,000 mg by mouth daily.    Historical Provider, MD  OVER THE COUNTER MEDICATION Place 1 drop into both eyes 2 (two) times daily. Over the counter lubricating  eye drop    Historical Provider, MD  TURMERIC PO Take 2 capsules by mouth daily.    Historical Provider, MD  vancomycin 1,500 mg in sodium chloride 0.9 % 500 mL Inject 1,500 mg into the vein every 12 (twelve) hours. 06/23/16   Leighton Parody, PA-C  vancomycin IVPB Inject 1,500 mg into the vein every 12 (twelve) hours. Indication:  Shoulder infection Last Day of Therapy:  08/02/2016 Labs - Sunday/Monday:  CBC/D, BMP, and vancomycin trough. Labs - Thursday:  BMP and vancomycin trough Labs - Every other week:  ESR and CRP 06/24/16 08/02/16  Grier Mitts, PA-C  vitamin E (VITAMIN E) 400 UNIT capsule Take 400 Units by mouth daily.      Historical  Provider, MD    Family History Family History  Problem Relation Age of Onset  . Heart disease Father 12    MI;smoker  . Diabetes Brother     juvenile    Social History Social History  Substance Use Topics  . Smoking status: Never Smoker  . Smokeless tobacco: Never Used  . Alcohol use 1.2 oz/week    2 Glasses of wine per week     Comment:  occasional     Allergies   Codeine and Morphine and related   Review of Systems Review of Systems  Constitutional: Positive for chills and fever.  HENT: Negative for congestion and facial swelling.   Eyes: Negative for discharge and visual disturbance.  Respiratory: Negative for shortness of breath.   Cardiovascular: Negative for chest pain and palpitations.  Gastrointestinal: Negative for abdominal pain, diarrhea and vomiting.  Musculoskeletal: Positive for myalgias. Negative for arthralgias.  Skin: Negative for color change and rash.  Neurological: Negative for tremors, syncope and headaches.  Psychiatric/Behavioral: Negative for confusion and dysphoric mood.     Physical Exam Updated Vital Signs BP 134/75   Pulse (!) 55   Temp 98.7 F (37.1 C) (Oral)   Resp 18   Ht 6' 7"  (2.007 m)   Wt 203 lb (92.1 kg)   SpO2 99%   BMI 22.87 kg/m   Physical Exam  Constitutional: He is oriented  to person, place, and time. He appears well-developed and well-nourished.  HENT:  Head: Normocephalic and atraumatic.  Eyes: EOM are normal. Pupils are equal, round, and reactive to light.  Neck: Normal range of motion. Neck supple. No JVD present.  Cardiovascular: Normal rate and regular rhythm.  Exam reveals no gallop and no friction rub.   No murmur heard. Pulmonary/Chest: No respiratory distress. He has no wheezes.  Abdominal: He exhibits no distension. There is no rebound and no guarding.  Musculoskeletal:  Mild tenderness about the right shoulder. No noted swelling. No erythema.  PICC line to the left with no tenderness no erythema no warmth.  Neurological: He is alert and oriented to person, place, and time.  Skin: No rash noted. No pallor.  Psychiatric: He has a normal mood and affect. His behavior is normal.  Nursing note and vitals reviewed.    ED Treatments / Results  Labs (all labs ordered are listed, but only abnormal results are displayed) Labs Reviewed  CULTURE, BLOOD (ROUTINE X 2)  CULTURE, BLOOD (ROUTINE X 2)    EKG  EKG Interpretation None       Radiology No results found.  Procedures Procedures (including critical care time)  Medications Ordered in ED Medications - No data to display   Initial Impression / Assessment and Plan / ED Course  I have reviewed the triage vital signs and the nursing notes.  Pertinent labs & imaging results that were available during my care of the patient were reviewed by me and considered in my medical decision making (see chart for details).     71 yo M With a chief complaint of what sounds like Rigors after he had vancomycin injected through his PICC line. Will obtain a culture from the PICC line is well as peripherally. Afebrile and well-appearing here. Does not clinically appear to be infected. Patient is upset that he still has a PICC line in place. He says he is still willing to use it though at this point would  be unwilling to use vancomycin. I discussed the case with Dr. Johnnye Sima, infectious  disease. He concurred with the culture obtaining and recommend continue current therapy. The patient will like to go home and do a trial of his Rocephin. If he is able to do that then he will contact Dr. Johnnye Sima about a change of vancomycin to a different antibiotic.  5:18 PM:  I have discussed the diagnosis/risks/treatment options with the patient and family and believe the pt to be eligible for discharge home to follow-up with PCP. We also discussed returning to the ED immediately if new or worsening sx occur. We discussed the sx which are most concerning (e.g., sudden worsening pain, fever, inability to tolerate by mouth) that necessitate immediate return. Medications administered to the patient during their visit and any new prescriptions provided to the patient are listed below.  Medications given during this visit Medications - No data to display   The patient appears reasonably screen and/or stabilized for discharge and I doubt any other medical condition or other [facility] requiring further screening, evaluation, or treatment in the ED at this time prior to discharge.    Final Clinical Impressions(s) / ED Diagnoses   Final diagnoses:  Rigors    New Prescriptions New Prescriptions   No medications on file     Deno Etienne, DO 06/27/16 [place], DO 06/27/16 1719

## 2016-06-27 NOTE — ED Notes (Signed)
Pt reports fever at home of 100 orally. Hes taken 2 doses of tylenol today with last dose at 1400

## 2016-06-28 ENCOUNTER — Telehealth: Payer: Self-pay | Admitting: *Deleted

## 2016-06-28 NOTE — Telephone Encounter (Signed)
pic is doing well Took IV ceftriaxone last pm and today without issue.  He does not want to take vanco.  He would not be able to take cubicin due to expense, insurance coverage.   Shoulder "doing great" Has been back to work.   Will - Hold vanco Await his Cx (possible anaerobe) If his Cx shows an anaerobe, will change him to unsayn or augmentin.

## 2016-06-28 NOTE — Telephone Encounter (Signed)
Patient called to advise that he does not feel comfortable restarting the Vanc as directed due to the reaction he had yesterday that sent him to the ED. He would like his PICC pulled and to be switched to oral medication. Advised him will send the doctor a message and ask and give him a call back with an answer as soon as possible

## 2016-06-29 ENCOUNTER — Encounter: Payer: Self-pay | Admitting: Infectious Diseases

## 2016-06-29 ENCOUNTER — Encounter (HOSPITAL_COMMUNITY): Payer: Medicare Other

## 2016-06-29 DIAGNOSIS — T814XXA Infection following a procedure, initial encounter: Secondary | ICD-10-CM | POA: Diagnosis not present

## 2016-06-29 DIAGNOSIS — M75111 Incomplete rotator cuff tear or rupture of right shoulder, not specified as traumatic: Secondary | ICD-10-CM | POA: Diagnosis not present

## 2016-06-29 DIAGNOSIS — M25511 Pain in right shoulder: Secondary | ICD-10-CM | POA: Diagnosis not present

## 2016-06-29 DIAGNOSIS — Z9889 Other specified postprocedural states: Secondary | ICD-10-CM | POA: Diagnosis not present

## 2016-06-29 MED ORDER — AMOXICILLIN-POT CLAVULANATE 875-125 MG PO TABS: 1.0000 | ORAL_TABLET | Freq: Two times a day (BID) | ORAL | 1 refills | Status: DC

## 2016-06-29 NOTE — Telephone Encounter (Signed)
Per Dr Johnnye Sima [facility] called and orders given to D/C PICC and that patient will be switched to oral medication.

## 2016-06-29 NOTE — Addendum Note (Signed)
Addended by: Reggy Eye on: 06/29/2016 04:31 PM   Modules accepted: Orders

## 2016-06-29 NOTE — Telephone Encounter (Signed)
Ok to stop ceftriaxone. Pull pic augmentin 875mg  po bid for 28 days or until he has f/u with me.  His Culture is still cooking.  thanks

## 2016-06-29 NOTE — Telephone Encounter (Signed)
Patient aware of D/C PICC and oral medication called to pharmacy.

## 2016-06-30 LAB — AEROBIC/ANAEROBIC CULTURE W GRAM STAIN (SURGICAL/DEEP WOUND)

## 2016-06-30 LAB — AEROBIC/ANAEROBIC CULTURE (SURGICAL/DEEP WOUND)

## 2016-07-02 LAB — CULTURE, BLOOD (ROUTINE X 2)
CULTURE: NO GROWTH
Culture: NO GROWTH
SPECIAL REQUESTS: ADEQUATE
SPECIAL REQUESTS: ADEQUATE

## 2016-07-03 ENCOUNTER — Encounter (HOSPITAL_COMMUNITY): Payer: Medicare Other

## 2016-07-04 ENCOUNTER — Telehealth: Payer: Self-pay | Admitting: *Deleted

## 2016-07-04 ENCOUNTER — Telehealth: Payer: Self-pay | Admitting: Family Medicine

## 2016-07-04 NOTE — Telephone Encounter (Signed)
6/6 hospital follow up with you. He will come here to pick up a stool kit if he cannot get into PCP for testing ([place]). Landis Gandy, RN

## 2016-07-04 NOTE — Telephone Encounter (Signed)
Please check C diff Most likely due to amoxil however Does he have f/u appt with me? thanks

## 2016-07-04 NOTE — Telephone Encounter (Signed)
Sorry, must be seen in office for evaluation prior to getting orders for stool collection. Remind him that diarrhea is a common side effect of augmentin.-thx

## 2016-07-04 NOTE — Telephone Encounter (Signed)
Patient states he has been taking oral augment for the past week and thinks he has c-diff.  He wants to know if he can pick up a stool kit from office today.

## 2016-07-04 NOTE — Telephone Encounter (Signed)
Patient hasn't been seen since 09/23/15.  Looks like he has been in the hospital a couple times.   Please advise?

## 2016-07-04 NOTE — Telephone Encounter (Signed)
Patient called, stating he has started having explosive diarrhea and a temperature of 99.8.  He stopped IV antibiotics, is now on oral augmentin twice daily. He is concerned about the risk of c diff. Please advise.  Landis Gandy, RN

## 2016-07-05 ENCOUNTER — Telehealth: Payer: Self-pay | Admitting: Infectious Diseases

## 2016-07-05 ENCOUNTER — Other Ambulatory Visit: Payer: Medicare Other

## 2016-07-05 DIAGNOSIS — R197 Diarrhea, unspecified: Secondary | ICD-10-CM | POA: Diagnosis not present

## 2016-07-05 DIAGNOSIS — Z96619 Presence of unspecified artificial shoulder joint: Principal | ICD-10-CM

## 2016-07-05 DIAGNOSIS — T8459XD Infection and inflammatory reaction due to other internal joint prosthesis, subsequent encounter: Secondary | ICD-10-CM

## 2016-07-05 MED ORDER — DOXYCYCLINE HYCLATE 100 MG PO TABS: 100.0000 mg | ORAL_TABLET | Freq: Two times a day (BID) | ORAL | 2 refills | Status: AC

## 2016-07-05 NOTE — Telephone Encounter (Signed)
Pt in clinic to drop off stool C diff Will change  him to doxy after long discussion. Stop augmentin Will see him back in 6 week.s

## 2016-07-05 NOTE — Telephone Encounter (Signed)
Left detailed message for patient to schedule OV.

## 2016-07-05 NOTE — Telephone Encounter (Signed)
Patient states Dr Johnnye Sima at [facility] is actually wanting this test, patient just wants to pick up collection kit here and take it to Dr Johnnye Sima.

## 2016-07-05 NOTE — Telephone Encounter (Signed)
Per Dr Anitra Lauth, okay to give patient stool kit for him to drop off at Dr Algis Downs office.

## 2016-07-05 NOTE — Telephone Encounter (Signed)
Called pt to check in Left voice mail.

## 2016-07-06 ENCOUNTER — Encounter (HOSPITAL_COMMUNITY): Payer: Medicare Other

## 2016-07-06 ENCOUNTER — Telehealth: Payer: Self-pay

## 2016-07-06 DIAGNOSIS — M75111 Incomplete rotator cuff tear or rupture of right shoulder, not specified as traumatic: Secondary | ICD-10-CM | POA: Diagnosis not present

## 2016-07-06 DIAGNOSIS — M25511 Pain in right shoulder: Secondary | ICD-10-CM | POA: Diagnosis not present

## 2016-07-06 LAB — CLOSTRIDIUM DIFFICILE BY PCR: CDIFFPCR: DETECTED — AB

## 2016-07-06 MED FILL — VANCOMYCIN HCL 125 MG CAP: 125 | 14 days supply | Qty: 56 | Fill #0

## 2016-07-06 NOTE — Telephone Encounter (Signed)
Called Pt to inform him that his test for C.Diff came back positive and that Dr.Hatcher has written him an Rx for Vancomycin mg q6h po for 14 days. Rx has been called in to [facility] per Dr's orders. Pt did not answer and I was unable to leave message. Will try to call again later.

## 2016-07-07 ENCOUNTER — Telehealth: Payer: Self-pay | Admitting: Infectious Diseases

## 2016-07-07 DIAGNOSIS — K862 Cyst of pancreas: Secondary | ICD-10-CM | POA: Diagnosis not present

## 2016-07-07 DIAGNOSIS — M75111 Incomplete rotator cuff tear or rupture of right shoulder, not specified as traumatic: Secondary | ICD-10-CM | POA: Diagnosis not present

## 2016-07-07 DIAGNOSIS — M25511 Pain in right shoulder: Secondary | ICD-10-CM | POA: Diagnosis not present

## 2016-07-07 MED ORDER — VANCOMYCIN 50 MG/ML ORAL SOLUTION: 125.0000 mg | Freq: Four times a day (QID) | ORAL | 0 refills | Status: AC

## 2016-07-07 NOTE — Telephone Encounter (Signed)
Called pt, he had not gotten his rx for C diff.  Will call these in. Feels better.  Will cont doxy   Spoke with [facility]-  They already had rx.  They will call pt

## 2016-07-10 ENCOUNTER — Encounter: Payer: Self-pay | Admitting: Family Medicine

## 2016-07-10 DIAGNOSIS — M75111 Incomplete rotator cuff tear or rupture of right shoulder, not specified as traumatic: Secondary | ICD-10-CM | POA: Diagnosis not present

## 2016-07-10 DIAGNOSIS — M25511 Pain in right shoulder: Secondary | ICD-10-CM | POA: Diagnosis not present

## 2016-07-12 DIAGNOSIS — M75111 Incomplete rotator cuff tear or rupture of right shoulder, not specified as traumatic: Secondary | ICD-10-CM | POA: Diagnosis not present

## 2016-07-12 DIAGNOSIS — M25511 Pain in right shoulder: Secondary | ICD-10-CM | POA: Diagnosis not present

## 2016-07-16 ENCOUNTER — Other Ambulatory Visit: Payer: Self-pay | Admitting: Cardiology

## 2016-07-16 DIAGNOSIS — I4892 Unspecified atrial flutter: Secondary | ICD-10-CM

## 2016-07-21 DIAGNOSIS — M75111 Incomplete rotator cuff tear or rupture of right shoulder, not specified as traumatic: Secondary | ICD-10-CM | POA: Diagnosis not present

## 2016-07-21 DIAGNOSIS — M25511 Pain in right shoulder: Secondary | ICD-10-CM | POA: Diagnosis not present

## 2016-07-28 DIAGNOSIS — M75111 Incomplete rotator cuff tear or rupture of right shoulder, not specified as traumatic: Secondary | ICD-10-CM | POA: Diagnosis not present

## 2016-07-28 DIAGNOSIS — M25511 Pain in right shoulder: Secondary | ICD-10-CM | POA: Diagnosis not present

## 2016-07-31 ENCOUNTER — Telehealth: Payer: Self-pay

## 2016-07-31 NOTE — Telephone Encounter (Signed)
Patient is calling to see if he needs to continue vancomycin and doxycyline . He is no longer having loose stools and the last loose stool was 2 weeks ago.   Please advise.  He will need refills if he is to continue medications.    Laverle Patter, RN

## 2016-08-01 NOTE — Telephone Encounter (Signed)
He can stop po vanco now (has had 2 weeks) He can stop doxy next week (june 11) which should equal 6 weeks of therapy.  i'd be glad to see him in the clinic if he wishes

## 2016-08-01 NOTE — Telephone Encounter (Signed)
Patient notified and he said thanks; he does not believe he will need a follow up. Myrtis Hopping  CMA

## 2016-08-02 ENCOUNTER — Inpatient Hospital Stay: Payer: Medicare Other | Admitting: Infectious Diseases

## 2016-08-02 DIAGNOSIS — M75111 Incomplete rotator cuff tear or rupture of right shoulder, not specified as traumatic: Secondary | ICD-10-CM | POA: Diagnosis not present

## 2016-08-02 DIAGNOSIS — M25511 Pain in right shoulder: Secondary | ICD-10-CM | POA: Diagnosis not present

## 2016-08-07 ENCOUNTER — Inpatient Hospital Stay: Payer: Medicare Other | Admitting: Infectious Diseases

## 2016-08-18 DIAGNOSIS — Z23 Encounter for immunization: Secondary | ICD-10-CM | POA: Diagnosis not present

## 2016-08-25 DIAGNOSIS — D485 Neoplasm of uncertain behavior of skin: Secondary | ICD-10-CM | POA: Diagnosis not present

## 2016-08-25 DIAGNOSIS — L570 Actinic keratosis: Secondary | ICD-10-CM | POA: Diagnosis not present

## 2016-08-25 DIAGNOSIS — D2372 Other benign neoplasm of skin of left lower limb, including hip: Secondary | ICD-10-CM | POA: Diagnosis not present

## 2016-08-25 DIAGNOSIS — L820 Inflamed seborrheic keratosis: Secondary | ICD-10-CM | POA: Diagnosis not present

## 2016-08-25 DIAGNOSIS — D225 Melanocytic nevi of trunk: Secondary | ICD-10-CM | POA: Diagnosis not present

## 2016-08-25 DIAGNOSIS — Z85828 Personal history of other malignant neoplasm of skin: Secondary | ICD-10-CM | POA: Diagnosis not present

## 2016-08-25 DIAGNOSIS — Z87898 Personal history of other specified conditions: Secondary | ICD-10-CM | POA: Diagnosis not present

## 2016-08-25 DIAGNOSIS — L821 Other seborrheic keratosis: Secondary | ICD-10-CM | POA: Diagnosis not present

## 2016-08-25 DIAGNOSIS — Z86018 Personal history of other benign neoplasm: Secondary | ICD-10-CM | POA: Diagnosis not present

## 2016-09-08 NOTE — Addendum Note (Signed)
Addendum  created 09/08/16 1202 by Lyndle Herrlich, MD   Sign clinical note

## 2016-09-08 NOTE — Anesthesia Postprocedure Evaluation (Signed)
Anesthesia Post Note  Patient: Kurt Young, DDS  Procedure(s) Performed: Procedure(s) (LRB): IRRIGATION AND DEBRIDEMENT SHOULDER (Right)     Anesthesia Post Evaluation  Last Vitals:  Vitals:   06/24/16 0938 06/24/16 1345  BP:  (!) 152/67  Pulse: 62 63  Resp: 18 16  Temp:  36.4 C    Last Pain:  Vitals:   06/24/16 1345  TempSrc: Oral  PainSc:                  Riccardo Dubin

## 2016-10-04 DIAGNOSIS — M25511 Pain in right shoulder: Secondary | ICD-10-CM | POA: Diagnosis not present

## 2016-10-13 DIAGNOSIS — K862 Cyst of pancreas: Secondary | ICD-10-CM | POA: Diagnosis not present

## 2016-10-13 DIAGNOSIS — M25511 Pain in right shoulder: Secondary | ICD-10-CM | POA: Diagnosis not present

## 2016-10-20 ENCOUNTER — Encounter: Payer: Self-pay | Admitting: *Deleted

## 2016-10-25 ENCOUNTER — Telehealth: Payer: Self-pay | Admitting: Family Medicine

## 2016-10-25 DIAGNOSIS — Z9229 Personal history of other drug therapy: Secondary | ICD-10-CM

## 2016-10-25 NOTE — Telephone Encounter (Signed)
SW pt and advised him that we do not have a record Hep B or TB skin test. I advised pt to contact his new job and see if they will accept a Hep B titer. He is going to call back to let us know.

## 2016-10-25 NOTE — Telephone Encounter (Signed)
Patient calling back to report ok to have Hep B titer.  Please order so he can be scheduled to have done.  He also wants to know how long it takes for results to come in for this test.

## 2016-10-25 NOTE — Telephone Encounter (Signed)
Patient called back to schedule titer, patient did not schedule TB skin test. He said he didn't need it.

## 2016-10-25 NOTE — Telephone Encounter (Signed)
Hep B titer ordered. TB skin test ok--I did not order it, though. I am guessing the Hep B titer results take about 2 days to result.-thx

## 2016-10-25 NOTE — Telephone Encounter (Signed)
LMOM for pt to CB to schedule nurse visit.

## 2016-10-25 NOTE — Telephone Encounter (Signed)
Patient started new job needs Hep B, Tuberculosis vaccine record. 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

## 2016-10-25 NOTE — Telephone Encounter (Signed)
Okay for Hep B titer and TB skin test?

## 2016-10-28 DIAGNOSIS — Z789 Other specified health status: Secondary | ICD-10-CM

## 2016-10-28 HISTORY — DX: Other specified health status: Z78.9

## 2016-11-03 ENCOUNTER — Other Ambulatory Visit (INDEPENDENT_AMBULATORY_CARE_PROVIDER_SITE_OTHER): Payer: Medicare Other

## 2016-11-03 DIAGNOSIS — Z9229 Personal history of other drug therapy: Secondary | ICD-10-CM | POA: Diagnosis not present

## 2016-11-04 LAB — HEPATITIS B SURFACE ANTIBODY, QUANTITATIVE: Hepatitis B-Post: <5 m[IU]/mL — ABNORMAL LOW (ref >=10)

## 2016-11-05 ENCOUNTER — Encounter: Payer: Self-pay | Admitting: Family Medicine

## 2017-01-28 ENCOUNTER — Other Ambulatory Visit: Payer: Self-pay | Admitting: Cardiology

## 2017-01-28 DIAGNOSIS — I4892 Unspecified atrial flutter: Secondary | ICD-10-CM

## 2017-03-26 DIAGNOSIS — H353131 Nonexudative age-related macular degeneration, bilateral, early dry stage: Secondary | ICD-10-CM | POA: Diagnosis not present

## 2017-03-26 DIAGNOSIS — H2513 Age-related nuclear cataract, bilateral: Secondary | ICD-10-CM | POA: Diagnosis not present

## 2017-03-26 DIAGNOSIS — H532 Diplopia: Secondary | ICD-10-CM | POA: Diagnosis not present

## 2017-03-26 DIAGNOSIS — H04123 Dry eye syndrome of bilateral lacrimal glands: Secondary | ICD-10-CM | POA: Diagnosis not present

## 2017-04-16 ENCOUNTER — Other Ambulatory Visit: Payer: Self-pay

## 2017-04-16 DIAGNOSIS — I4892 Unspecified atrial flutter: Secondary | ICD-10-CM

## 2017-04-16 MED ORDER — METOPROLOL TARTRATE 25 MG PO TABS: 25.0000 mg | ORAL_TABLET | Freq: Two times a day (BID) | ORAL | 0 refills | Status: DC

## 2017-06-05 ENCOUNTER — Other Ambulatory Visit: Payer: Self-pay | Admitting: Gastroenterology

## 2017-06-05 DIAGNOSIS — K862 Cyst of pancreas: Secondary | ICD-10-CM

## 2017-06-06 DIAGNOSIS — R339 Retention of urine, unspecified: Secondary | ICD-10-CM | POA: Diagnosis not present

## 2017-06-06 DIAGNOSIS — R310 Gross hematuria: Secondary | ICD-10-CM | POA: Diagnosis not present

## 2017-06-10 ENCOUNTER — Ambulatory Visit
Admission: RE | Admit: 2017-06-10 | Discharge: 2017-06-10 | Disposition: A | Discharge status: Home / Self Care | Payer: Medicare Other | Source: Ambulatory Visit | Attending: Gastroenterology | Admitting: Gastroenterology

## 2017-06-10 ENCOUNTER — Other Ambulatory Visit: Payer: Self-pay | Admitting: Cardiology

## 2017-06-10 DIAGNOSIS — K862 Cyst of pancreas: Secondary | ICD-10-CM

## 2017-06-10 DIAGNOSIS — I4892 Unspecified atrial flutter: Secondary | ICD-10-CM

## 2017-06-10 MED ORDER — GADOBENATE DIMEGLUMINE 529 MG/ML IV SOLN
18.0000 mL | Freq: Once | INTRAVENOUS | Status: AC | PRN
  Administered 2017-06-10: 18 mL via INTRAVENOUS

## 2017-07-02 ENCOUNTER — Other Ambulatory Visit: Payer: Self-pay | Admitting: Cardiology

## 2017-07-02 DIAGNOSIS — I4892 Unspecified atrial flutter: Secondary | ICD-10-CM

## 2017-09-11 DIAGNOSIS — N400 Enlarged prostate without lower urinary tract symptoms: Secondary | ICD-10-CM | POA: Diagnosis not present

## 2017-09-11 DIAGNOSIS — E291 Testicular hypofunction: Secondary | ICD-10-CM | POA: Diagnosis not present

## 2017-09-12 ENCOUNTER — Other Ambulatory Visit: Payer: Self-pay | Admitting: Cardiology

## 2017-09-12 DIAGNOSIS — N320 Bladder-neck obstruction: Secondary | ICD-10-CM | POA: Diagnosis not present

## 2017-09-12 DIAGNOSIS — N401 Enlarged prostate with lower urinary tract symptoms: Secondary | ICD-10-CM | POA: Diagnosis not present

## 2017-10-28 ENCOUNTER — Other Ambulatory Visit: Payer: Self-pay | Admitting: Cardiology

## 2017-10-31 NOTE — Telephone Encounter (Signed)
Patient last seen by Dr Marlou Porch in 2017 and that note states that the patient was to d/c this medication but mutiple refills have been sent in since then. Please advise. Thanks, MI

## 2017-11-01 NOTE — Telephone Encounter (Signed)
Reviewed with Dr Marlou Porch.  OK to refill once more if pt feels he needs the Metoprolol however he much be seen in the office by Dr Marlou Porch or obtain further refills from PCP.

## 2017-11-01 NOTE — Telephone Encounter (Signed)
Spoke with patient and he stated that he still needs the medication. I made him aware that I could send in one refill but he would need to schedule an appointment for further refills or obtain from pcp. He tells me that he is in [place] currently and bracing for the storm but once he returns to the area he will look at his work calendar and call and schedule an appointment.

## 2017-11-26 DIAGNOSIS — Z23 Encounter for immunization: Secondary | ICD-10-CM | POA: Diagnosis not present

## 2017-12-10 ENCOUNTER — Other Ambulatory Visit: Payer: Self-pay | Admitting: Cardiology

## 2017-12-27 ENCOUNTER — Other Ambulatory Visit: Payer: Self-pay | Admitting: Cardiology

## 2018-01-10 ENCOUNTER — Other Ambulatory Visit: Payer: Self-pay | Admitting: Cardiology

## 2018-01-10 ENCOUNTER — Encounter: Payer: Self-pay | Admitting: *Deleted

## 2018-01-14 ENCOUNTER — Other Ambulatory Visit: Payer: Self-pay | Admitting: Cardiology

## 2018-02-12 ENCOUNTER — Other Ambulatory Visit: Payer: Self-pay | Admitting: Cardiology

## 2018-03-05 ENCOUNTER — Other Ambulatory Visit: Payer: Self-pay | Admitting: Cardiology

## 2018-03-05 DIAGNOSIS — I4892 Unspecified atrial flutter: Secondary | ICD-10-CM

## 2018-03-05 NOTE — Telephone Encounter (Signed)
° ° ° °*  STAT* If patient is at the pharmacy, call can be transferred to refill team.   1. Which medications need to be refilled? (please list name of each medication and dose if known) metoprolol tartrate (LOPRESSOR) 25 MG tablet  2. Which pharmacy/location (including street and city if local pharmacy) is medication to be sent to? [id number] - [place], [place] - [address]  3. Do they need a 30 day or 90 day supply? [place]

## 2018-03-06 NOTE — Telephone Encounter (Signed)
Okay to give him refills.  Encourage follow-up appointment Candee Furbish, MD

## 2018-03-06 NOTE — Telephone Encounter (Signed)
Pt calling requesting a refill on metoprolol 25 mg tablet. Pt has not been seen since 2017 and still has not made an appt when asked to. Would Dr. Marlou Porch like to refill this medication? Please address

## 2018-03-07 MED ORDER — METOPROLOL TARTRATE 25 MG PO TABS: 25.0000 mg | ORAL_TABLET | Freq: Two times a day (BID) | ORAL | 1 refills | Status: DC

## 2018-03-07 NOTE — Telephone Encounter (Signed)
Pt's medication was sent to pt's pharmacy as requested asking pt to make an overdue appt with Dr. Marlou Porch for future refills. Confirmation received.

## 2018-03-08 NOTE — Telephone Encounter (Signed)
   Chart reviewed as part of pre-operative protocol coverage. Not sure why this hit the pre-op box, seems to have been addressed. Will remove from APP obx.  Charlie Pitter, PA-C 03/08/2018, 9:48 AM

## 2018-05-02 ENCOUNTER — Other Ambulatory Visit: Payer: Self-pay | Admitting: Cardiology

## 2018-05-02 DIAGNOSIS — I4892 Unspecified atrial flutter: Secondary | ICD-10-CM

## 2018-05-30 ENCOUNTER — Other Ambulatory Visit: Payer: Self-pay | Admitting: Cardiology

## 2018-05-30 DIAGNOSIS — I4892 Unspecified atrial flutter: Secondary | ICD-10-CM

## 2018-07-08 ENCOUNTER — Other Ambulatory Visit: Payer: Self-pay | Admitting: Cardiology

## 2018-07-08 DIAGNOSIS — I4892 Unspecified atrial flutter: Secondary | ICD-10-CM

## 2018-10-23 ENCOUNTER — Other Ambulatory Visit: Payer: Self-pay | Admitting: Cardiology

## 2018-10-23 DIAGNOSIS — I4892 Unspecified atrial flutter: Secondary | ICD-10-CM

## 2020-01-18 IMAGING — MR MR ABDOMEN WO/W CM
16 of 20 series · 39 of 48 positions shown · IV contrast (Multihance 18ml)
Comparison: CT abdomen/pelvis dated 03/31/2016

CLINICAL DATA: Follow-up pancreatic cyst

EXAM:
MRI ABDOMEN WITHOUT AND WITH CONTRAST
TECHNIQUE: Multiplanar multisequence MR imaging of the abdomen was performed
both before and after the administration of intravenous contrast.
CONTRAST:  18mL MULTIHANCE GADOBENATE DIMEGLUMINE 529 MG/ML IV SOLN
Creatinine was obtained on site at [HOSPITAL] at [HOSPITAL].
Results: Creatinine 0.9 mg/dL.

[Series 3: T2 · coronal · 5.0mm · 1.56mm/px · 1 of 36 slices shown (1 of 4)]
[im 1/36]
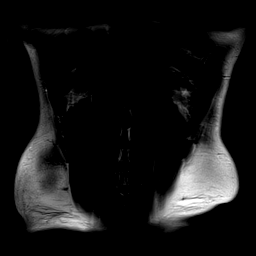

[Series 4: T1 · axial · 3.0mm · 1.19mm/px · z∈[-127,+110]mm · 5 of 160 slices shown]
[im 1/160]
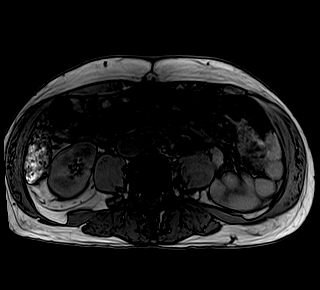
[im 40/160]
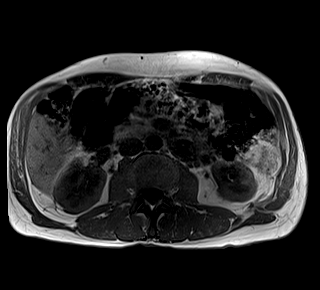
[im 80/160]
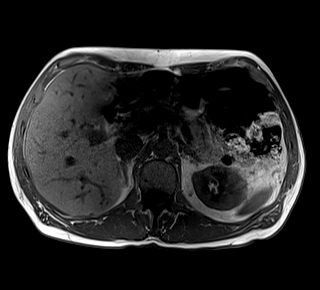
[im 120/160]
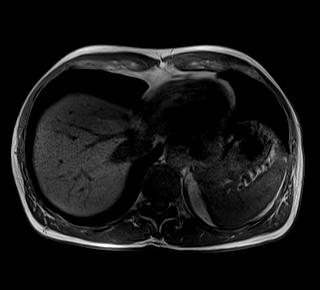
[im 160/160]
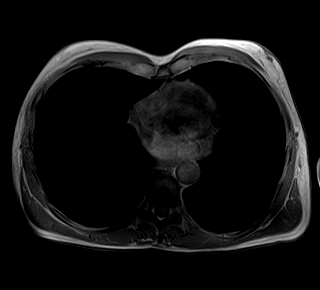

[Series 5: T2 · axial · 5.0mm · 1.48mm/px · 1 of 38 slices shown (2 of 4)]
[im 1/38]
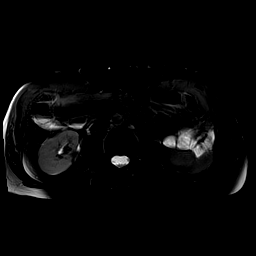

[Series 6: DWI · axial · 5.0mm · 1.42mm/px · z∈[-90,+156]mm · 4 of 126 slices shown (1 of 2)]
[im 1/126]
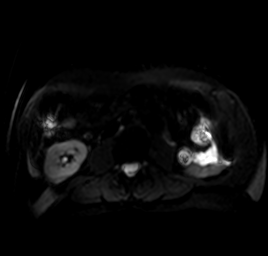
[im 42/126]
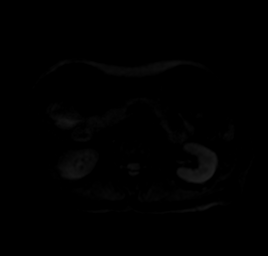
[im 84/126]
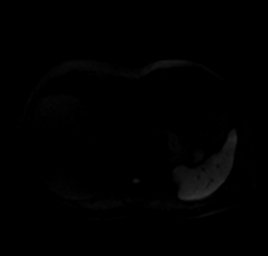
[im 126/126]
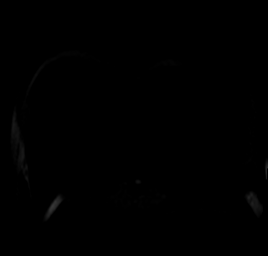

[Series 7: DWI · axial · 5.0mm · 1.42mm/px · 1 of 42 slices shown (2 of 2)]
[im 1/42]
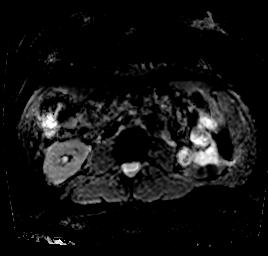

[Series 8: T2 · axial · 6.0mm · 1.22mm/px · 1 of 33 slices shown (3 of 4)]
[im 1/33]
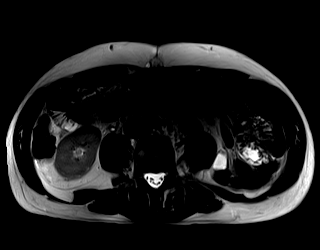

[Series 9: bSSFP · axial · 5.0mm · 1.25mm/px · 1 of 38 slices shown]
[im 1/38]
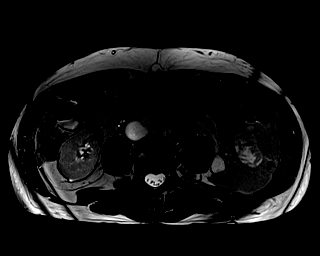

[Series 13: MRCP · coronal · 1.0mm · 0.49mm/px · 3 of 88 slices shown]
[im 1/88]
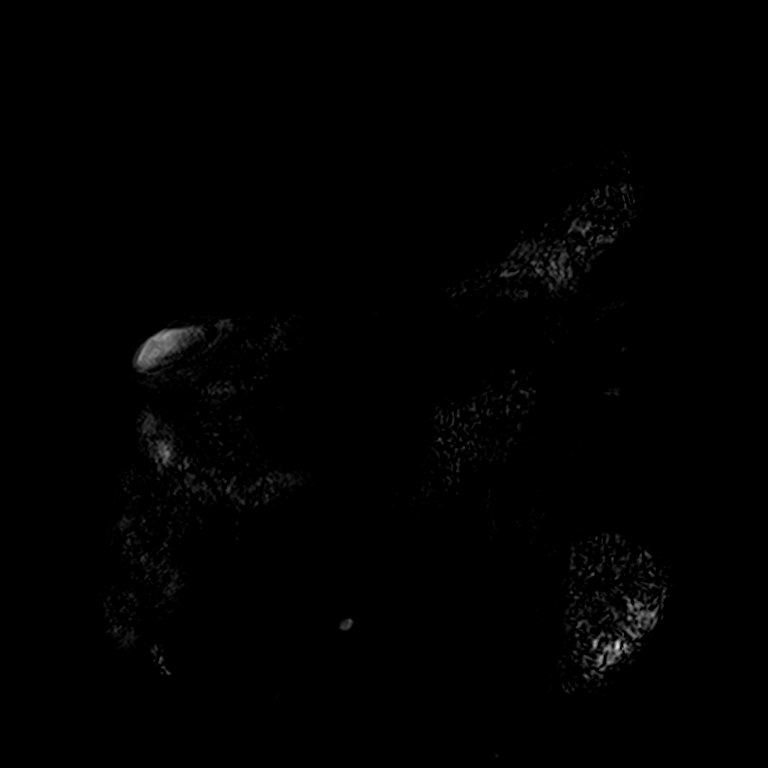
[im 44/88]
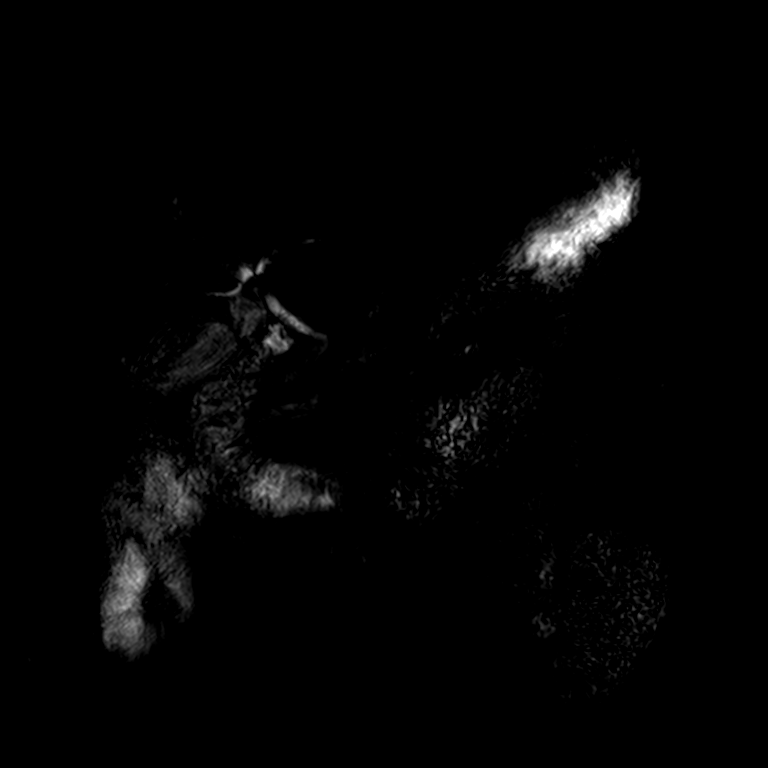
[im 88/88]
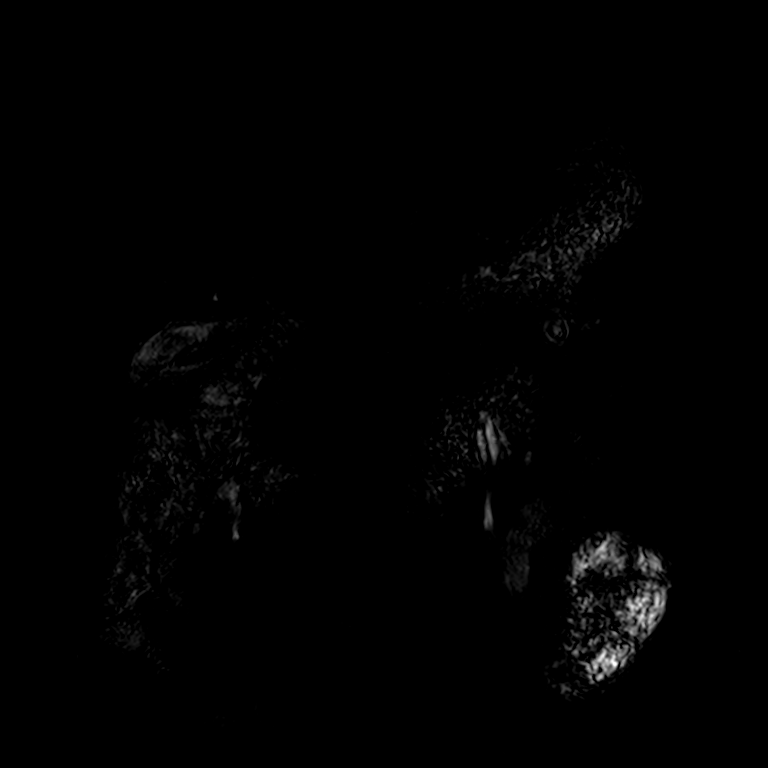

[Series 15: T2 · coronal · 3.0mm · 1.48mm/px · 1 of 27 slices shown (4 of 4)]
[im 1/27]
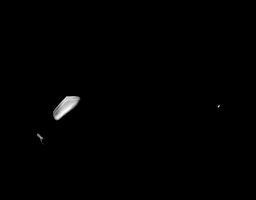

[Series 16: T1 dynamic · axial · non-contrast · 3.0mm · 1.25mm/px · z∈[-142,+95]mm · 3 of 80 slices shown]
[im 1/80]
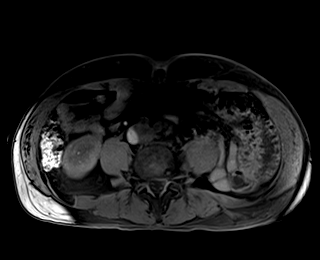
[im 40/80]
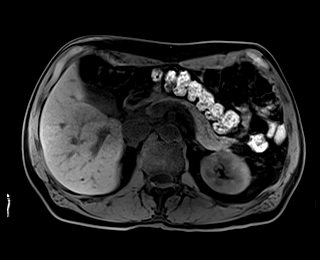
[im 80/80]
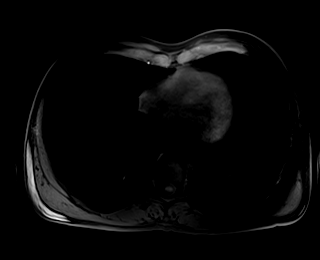

[Series 17: T1 dynamic post-contrast · axial · 3.0mm · 1.25mm/px · z∈[-142,+95]mm · 3 of 80 slices shown (1 of 6)]
[im 1/80]
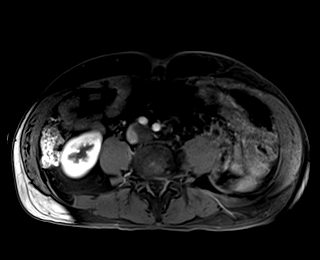
[im 40/80]
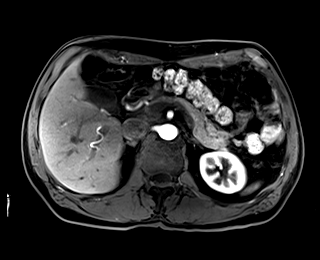
[im 80/80]
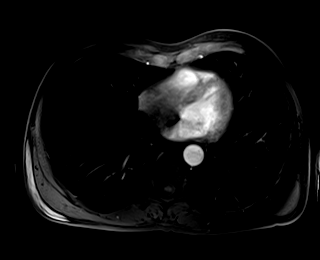

[Series 18: T1 dynamic post-contrast · axial · 3.0mm · 1.25mm/px · z∈[-142,+95]mm · 3 of 80 slices shown (2 of 6)]
[im 1/80]
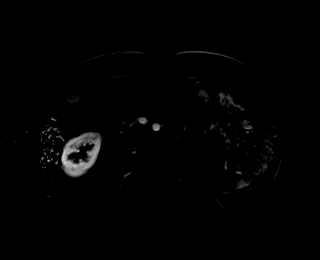
[im 40/80]
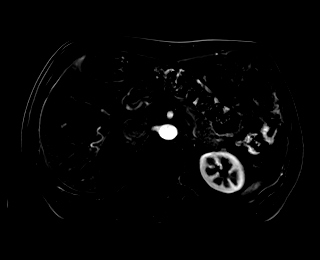
[im 80/80]
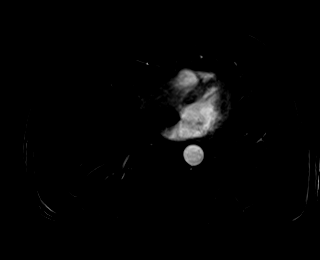

[Series 19: T1 dynamic post-contrast · axial · 3.0mm · 1.25mm/px · z∈[-142,+95]mm · 3 of 80 slices shown (3 of 6)]
[im 1/80]
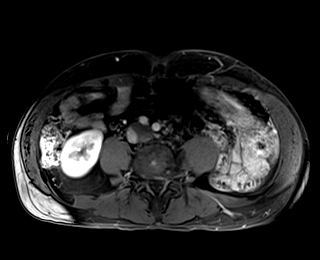
[im 40/80]
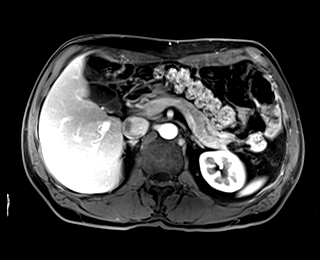
[im 80/80]
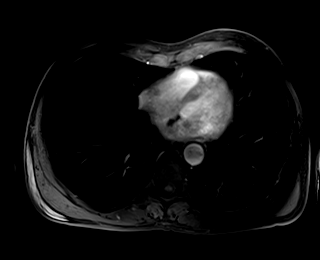

[Series 20: T1 dynamic post-contrast · axial · 3.0mm · 1.25mm/px · z∈[-142,+95]mm · 3 of 80 slices shown (4 of 6)]
[im 1/80]
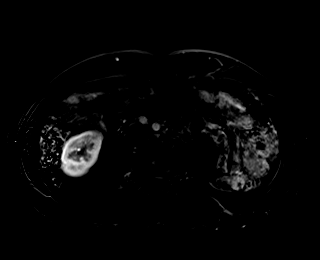
[im 40/80]
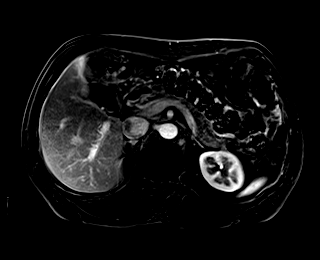
[im 80/80]
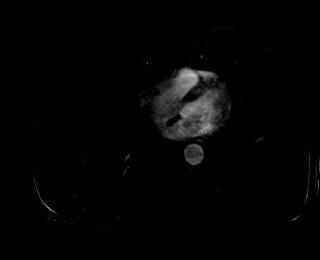

[Series 21: T1 dynamic post-contrast · axial · 3.0mm · 1.25mm/px · z∈[-142,+95]mm · 3 of 80 slices shown (5 of 6)]
[im 1/80]
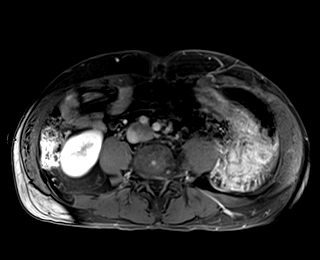
[im 40/80]
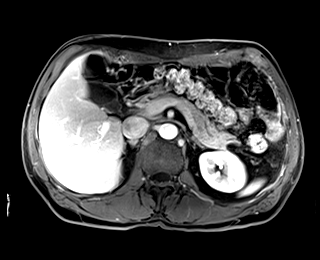
[im 80/80]
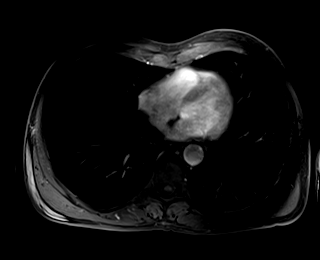

[Series 22: T1 dynamic post-contrast · axial · 3.0mm · 1.25mm/px · z∈[-142,+95]mm · 3 of 80 slices shown (6 of 6)]
[im 1/80]
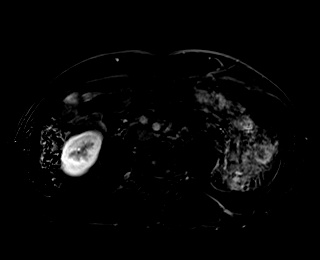
[im 40/80]
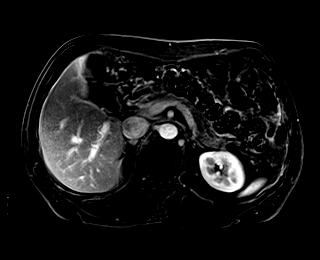
[im 80/80]
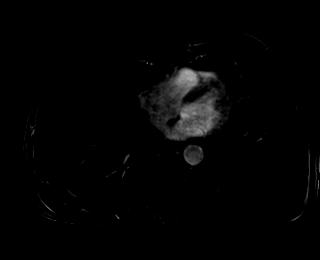

[39 of 48 positions shown; findings below may reference images not displayed]

FINDINGS: Lower chest: Lung bases are clear.

Hepatobiliary: 8 mm cyst medially in segment 7 (series 8/image 11).
Liver is otherwise within normal limits. No hepatic steatosis. No
suspicious/enhancing hepatic lesions.

Gallbladder is unremarkable. No intrahepatic or extrahepatic ductal
dilatation.

Pancreas: 12 x 17 mm unilocular cystic lesion in the pancreatic tail
(series 8/image 15). While this measures slightly larger than prior
CT, this may be due to differences in imaging technique. No definite
communication with the pancreatic duct. No parenchymal atrophy or
ductal dilatation.

Spleen:  Within normal limits.

Adrenals/Urinary Tract:  Adrenal glands are within normal limits.

Kidneys are within normal limits.  No hydronephrosis.

Stomach/Bowel: Stomach is within normal limits.

Visualized bowel is unremarkable.

Vascular/Lymphatic:  No evidence of abdominal aortic aneurysm.

No suspicious abdominal lymphadenopathy.

Other:  No abdominal ascites.

Musculoskeletal: No focal osseous lesions.
IMPRESSION: 12 x 17 mm unilocular cystic lesion in the pancreatic tail, favoring
a benign pseudocyst. Continual annual follow-up is suggested for a
minimum of 5 years.

This recommendation follows ACR consensus guidelines: Management of
Incidental Pancreatic Cysts: A White Paper of the ACR Incidental
Findings Committee. [HOSPITAL] 3938;[DATE].
